# Patient Record
Sex: Female | Born: 1955 | Race: Black or African American | Hispanic: No | Smoking: Former smoker
Health system: Southern US, Community
[De-identification: ages and names within clinical notes are randomized; demographics above are authoritative.]

## PROBLEM LIST (undated history)

## (undated) DIAGNOSIS — R920 Mammographic microcalcification found on diagnostic imaging of breast: Secondary | ICD-10-CM

## (undated) DIAGNOSIS — K635 Polyp of colon: Secondary | ICD-10-CM

## (undated) DIAGNOSIS — D0590 Unspecified type of carcinoma in situ of unspecified breast: Secondary | ICD-10-CM

## (undated) DIAGNOSIS — E119 Type 2 diabetes mellitus without complications: Secondary | ICD-10-CM

## (undated) DIAGNOSIS — C50919 Malignant neoplasm of unspecified site of unspecified female breast: Secondary | ICD-10-CM

## (undated) DIAGNOSIS — N6459 Other signs and symptoms in breast: Secondary | ICD-10-CM

## (undated) DIAGNOSIS — I10 Essential (primary) hypertension: Secondary | ICD-10-CM

## (undated) HISTORY — DX: Mammographic microcalcification found on diagnostic imaging of breast: R92.0

## (undated) HISTORY — DX: Essential (primary) hypertension: I10

## (undated) HISTORY — DX: Other signs and symptoms in breast: N64.59

## (undated) HISTORY — DX: Polyp of colon: K63.5

## (undated) HISTORY — DX: Type 2 diabetes mellitus without complications: E11.9

## (undated) HISTORY — DX: Unspecified type of carcinoma in situ of unspecified breast: D05.90

---

## 1996-02-10 HISTORY — PX: TONSILLECTOMY: SUR1361

## 2004-09-28 ENCOUNTER — Emergency Department: Payer: Self-pay | Admitting: Emergency Medicine

## 2005-01-30 ENCOUNTER — Emergency Department: Payer: Self-pay | Admitting: Emergency Medicine

## 2005-07-15 ENCOUNTER — Other Ambulatory Visit: Payer: Self-pay

## 2005-07-15 ENCOUNTER — Emergency Department: Payer: Self-pay | Admitting: Emergency Medicine

## 2008-02-10 DIAGNOSIS — E119 Type 2 diabetes mellitus without complications: Secondary | ICD-10-CM

## 2008-02-10 HISTORY — DX: Type 2 diabetes mellitus without complications: E11.9

## 2008-02-10 HISTORY — PX: GASTRIC BYPASS: SHX52

## 2009-01-13 ENCOUNTER — Emergency Department: Payer: Self-pay | Admitting: Unknown Physician Specialty

## 2009-02-26 ENCOUNTER — Emergency Department: Payer: Self-pay | Admitting: Internal Medicine

## 2010-09-01 ENCOUNTER — Ambulatory Visit: Payer: Self-pay | Admitting: Family Medicine

## 2011-02-10 DIAGNOSIS — N6459 Other signs and symptoms in breast: Secondary | ICD-10-CM

## 2011-02-10 HISTORY — DX: Other signs and symptoms in breast: N64.59

## 2012-01-04 ENCOUNTER — Ambulatory Visit: Payer: Self-pay | Admitting: Family Medicine

## 2012-01-27 ENCOUNTER — Ambulatory Visit: Payer: Self-pay | Admitting: Family Medicine

## 2012-01-29 ENCOUNTER — Ambulatory Visit: Payer: Self-pay | Admitting: Family Medicine

## 2012-02-01 ENCOUNTER — Emergency Department: Payer: Self-pay | Admitting: Emergency Medicine

## 2012-02-10 DIAGNOSIS — D0590 Unspecified type of carcinoma in situ of unspecified breast: Secondary | ICD-10-CM

## 2012-02-10 DIAGNOSIS — R920 Mammographic microcalcification found on diagnostic imaging of breast: Secondary | ICD-10-CM

## 2012-02-10 HISTORY — PX: BREAST MAMMOSITE: SHX5264

## 2012-02-10 HISTORY — PX: BREAST BIOPSY: SHX20

## 2012-02-10 HISTORY — DX: Mammographic microcalcification found on diagnostic imaging of breast: R92.0

## 2012-02-10 HISTORY — DX: Unspecified type of carcinoma in situ of unspecified breast: D05.90

## 2012-02-29 ENCOUNTER — Ambulatory Visit: Payer: Self-pay | Admitting: General Surgery

## 2012-03-07 ENCOUNTER — Ambulatory Visit: Payer: Self-pay | Admitting: General Surgery

## 2012-03-07 LAB — POTASSIUM: Potassium: 4 mmol/L (ref 3.5–5.1)

## 2012-03-10 LAB — PATHOLOGY REPORT

## 2012-03-12 DIAGNOSIS — C50919 Malignant neoplasm of unspecified site of unspecified female breast: Secondary | ICD-10-CM

## 2012-03-12 HISTORY — PX: BREAST LUMPECTOMY: SHX2

## 2012-03-12 HISTORY — DX: Malignant neoplasm of unspecified site of unspecified female breast: C50.919

## 2012-03-15 ENCOUNTER — Ambulatory Visit: Payer: Self-pay | Admitting: General Surgery

## 2012-03-31 ENCOUNTER — Ambulatory Visit: Payer: Self-pay | Admitting: Radiation Oncology

## 2012-04-09 ENCOUNTER — Ambulatory Visit: Payer: Self-pay | Admitting: Radiation Oncology

## 2012-05-10 ENCOUNTER — Ambulatory Visit: Payer: Self-pay | Admitting: Radiation Oncology

## 2012-05-13 ENCOUNTER — Encounter: Payer: Self-pay | Admitting: *Deleted

## 2012-05-13 DIAGNOSIS — D0590 Unspecified type of carcinoma in situ of unspecified breast: Secondary | ICD-10-CM | POA: Insufficient documentation

## 2012-05-23 ENCOUNTER — Encounter: Payer: Self-pay | Admitting: General Surgery

## 2012-05-23 ENCOUNTER — Ambulatory Visit (INDEPENDENT_AMBULATORY_CARE_PROVIDER_SITE_OTHER): Payer: BC Managed Care – PPO | Admitting: General Surgery

## 2012-05-23 VITALS — BP 130/74 | HR 74 | Resp 14 | Ht 68.0 in | Wt 213.0 lb

## 2012-05-23 DIAGNOSIS — D0590 Unspecified type of carcinoma in situ of unspecified breast: Secondary | ICD-10-CM

## 2012-05-23 DIAGNOSIS — D0592 Unspecified type of carcinoma in situ of left breast: Secondary | ICD-10-CM

## 2012-05-23 NOTE — Patient Instructions (Addendum)
Patient to return in August 2014 after unilateral left breast diagnostic mammogram.

## 2012-05-23 NOTE — Progress Notes (Signed)
Patient ID: Martha Waller, female   DOB: 1955-08-20, 57 y.o.   MRN: [id number]  Chief Complaint  Patient presents with  . Follow-up    breast cancer    HPI [name] is a 57 y.o. 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 Patient had a left breast lumpectomy in 2/14  Followed by Mammosite radiation.Patient states no new breast problems.Patient is still taking her tamoxifen. HPI  Past Medical History  Diagnosis Date  . 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 Hypertension   . Mammographic microcalcification 2014  . 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 Breast complaint 2013    Past Surgical History  Procedure Laterality Date  . Breast mammosite Left 2014  . Breast mammosite Left 2014    removal  . Colonoscopy w/ polypectomy  2011    2 small benign polyps removed, done by [facility]  . Breast lumpectomy Left February 2014  . Tonsillectomy  1998  . Gastric bypass  2010    Family History  Problem Relation Age of Onset  . Cancer Maternal Grandmother 60    throat cancer    Social History History  Substance Use Topics  . Smoking status: Former Smoker -- 0.50 packs/day for 20 years    Types: Cigarettes  . Smokeless tobacco: Never Used  . Alcohol Use: No    No Known Allergies  Current Outpatient Prescriptions  Medication Sig Dispense Refill  . Calcium Carbonate-Vitamin D (CALCIUM 600 + D PO) Take by mouth.      . Furosemide (LASIX PO) Take by mouth.      . hydrochlorothiazide (HYDRODIURIL) 50 MG tablet Take 50 mg by mouth daily.      Marland Kitchen levothyroxine (SYNTHROID, LEVOTHROID) 175 MCG tablet Take 175 mcg by mouth daily before breakfast.      . lisinopril (PRINIVIL,ZESTRIL) 10 MG tablet Take 10 mg by mouth daily.      . Multiple Vitamin (MULTIVITAMIN) tablet Take 1 tablet by mouth daily.      . tamoxifen (NOLVADEX) 20 MG tablet Take 20 mg by mouth daily.       No current  facility-administered medications for this visit.    Review of Systems Review of Systems  Constitutional: Negative.   Respiratory: Negative.   Cardiovascular: Negative.     Blood pressure 130/74, pulse 74, resp. rate 14, height 5\' 8"  (1.727 m), weight 213 lb (96.616 kg).  Physical Exam Physical Exam  Constitutional: She appears well-developed and well-nourished.  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 Doing well     Plan    Continue periodic monitoring.        SANKAR,SEEPLAPUTHUR G 05/23/2012, 7:54 PM

## 2012-06-17 ENCOUNTER — Ambulatory Visit: Payer: Self-pay | Admitting: Radiation Oncology

## 2012-09-15 ENCOUNTER — Ambulatory Visit: Payer: Self-pay | Admitting: General Surgery

## 2012-09-16 ENCOUNTER — Encounter: Payer: Self-pay | Admitting: General Surgery

## 2012-09-22 ENCOUNTER — Encounter: Payer: Self-pay | Admitting: General Surgery

## 2012-09-22 ENCOUNTER — Ambulatory Visit (INDEPENDENT_AMBULATORY_CARE_PROVIDER_SITE_OTHER): Payer: BC Managed Care – PPO | Admitting: General Surgery

## 2012-09-22 VITALS — BP 120/80 | HR 82 | Resp 16 | Ht 68.0 in | Wt 265.0 lb

## 2012-09-22 DIAGNOSIS — Z853 Personal history of malignant neoplasm of breast: Secondary | ICD-10-CM

## 2012-09-22 NOTE — Progress Notes (Signed)
Patient ID: Martha Waller, female   DOB: 10-29-1955, 57 y.o.   MRN: [id number]  Chief Complaint  Patient presents with  . Breast Cancer Long Term Follow Up    follow up mammogram     HPI Martha Waller is a 57 y.o. female who presents for a follow up breast evaluation. The most recent mammogram was done on 09/15/12 with a birad category 1. The patient denies any new problems with her breasts at this time. She is 6 months post left lumpectomy and radiation for DCIS.    HPI  Past Medical History  Diagnosis Date  . 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 Hypertension   . Mammographic microcalcification 2014  . 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 Breast complaint 2013    Past Surgical History  Procedure Laterality Date  . Breast mammosite Left 2014  . Breast mammosite Left 2014    removal  . Colonoscopy w/ polypectomy  2011    2 small benign polyps removed, done by [facility]  . Breast lumpectomy Left February 2014  . Tonsillectomy  1998  . Gastric bypass  2010    Family History  Problem Relation Age of Onset  . Cancer Maternal Grandmother 60    throat cancer    Social History History  Substance Use Topics  . Smoking status: Former Smoker -- 0.50 packs/day for 20 years    Types: Cigarettes  . Smokeless tobacco: Never Used  . Alcohol Use: No    No Known Allergies  Current Outpatient Prescriptions  Medication Sig Dispense Refill  . Calcium Carbonate-Vitamin D (CALCIUM 600 + D PO) Take by mouth.      . Furosemide (LASIX PO) Take by mouth.      . hydrochlorothiazide (HYDRODIURIL) 50 MG tablet Take 50 mg by mouth daily.      Marland Kitchen levothyroxine (SYNTHROID, LEVOTHROID) 175 MCG tablet Take 175 mcg by mouth daily before breakfast.      . lisinopril (PRINIVIL,ZESTRIL) 10 MG tablet Take 10 mg by mouth daily.      . Multiple Vitamin (MULTIVITAMIN) tablet Take 1 tablet by mouth daily.      . tamoxifen  (NOLVADEX) 20 MG tablet Take 20 mg by mouth daily.       No current facility-administered medications for this visit.    Review of Systems Review of Systems  Constitutional: Negative.   Respiratory: Negative.   Cardiovascular: Negative.     Blood pressure 120/80, pulse 82, resp. rate 16, height 5\' 8"  (1.727 m), weight 265 lb (120.203 kg).  Physical Exam Physical Exam  Constitutional: She is oriented to person, place, and time. She appears well-developed and well-nourished.  Eyes: Conjunctivae are normal. No scleral icterus.  Neck: No tracheal deviation present. No mass and no thyromegaly present.  Cardiovascular: Normal rate, regular rhythm, normal heart sounds and normal pulses.   No murmur heard. Pulses:      Dorsalis pedis pulses are 2+ on the right side, and 2+ on the left side.       Posterior tibial pulses are 2+ on the right side.  No edema. No calf or thigh tenderness.  Pulmonary/Chest: Effort normal and breath sounds normal. Right breast exhibits no inverted nipple, no mass, no nipple discharge, no skin change and no tenderness. Left breast exhibits no inverted nipple, no mass, no nipple discharge, no skin change and no tenderness.  Well healed incision site  of left breast.   Abdominal: Soft. Normal appearance and bowel sounds are normal. There is no hepatomegaly. There is no tenderness. No hernia.  Lymphadenopathy:    She has no cervical adenopathy.    She has no axillary adenopathy.  Neurological: She is alert and oriented to person, place, and time.  Skin: Skin is warm and dry.    Data Reviewed Mammogram reviewed.   Assessment    Stable exam.      Plan    Follow up in 6 months with bilateral diagnostic mammogram.  Continue with Tamoxifen       Darthy Manganelli G 09/22/2012, 6:52 PM

## 2012-09-22 NOTE — Patient Instructions (Addendum)
Patient advised to continue self exams. Follow up in 6 months with bilateral diagnostic mammogram.

## 2012-12-07 ENCOUNTER — Emergency Department: Payer: Self-pay | Admitting: Internal Medicine

## 2013-03-21 ENCOUNTER — Ambulatory Visit: Payer: Self-pay | Admitting: General Surgery

## 2013-03-21 ENCOUNTER — Encounter: Payer: Self-pay | Admitting: General Surgery

## 2013-03-29 ENCOUNTER — Ambulatory Visit (INDEPENDENT_AMBULATORY_CARE_PROVIDER_SITE_OTHER): Payer: BC Managed Care – PPO | Admitting: General Surgery

## 2013-03-29 ENCOUNTER — Encounter: Payer: Self-pay | Admitting: General Surgery

## 2013-03-29 VITALS — BP 140/80 | HR 72 | Resp 14 | Ht 68.0 in | Wt 263.0 lb

## 2013-03-29 DIAGNOSIS — D0510 Intraductal carcinoma in situ of unspecified breast: Secondary | ICD-10-CM

## 2013-03-29 DIAGNOSIS — D0590 Unspecified type of carcinoma in situ of unspecified breast: Secondary | ICD-10-CM

## 2013-03-29 NOTE — Patient Instructions (Signed)
Continue self breast exams. Call office for any new breast issues or concerns. 

## 2013-03-29 NOTE — Progress Notes (Signed)
Patient ID: Martha Waller, female   DOB: 1955/10/10, 58 y.o.   MRN: [id number]  Chief Complaint  Patient presents with  . Follow-up    mammogram follow up and history of breast cancer    HPI Martha Waller is a 58 y.o. female.  who presents for her follow up mammogram and breast evaluation. The most recent mammogram was done on 03-22-13. She has known history of DCIS left breast cancer. Patient does perform regular self breast checks and gets regular mammograms done.  No new breast issues. She is on Tamoxifen and states she is having muscle/joint aches, night sweats and weight gain. Symptoms are mild and tolerable.  HPI  Past Medical History  Diagnosis Date  . Diabetes mellitus without complication 3536    type 2, resolved since gastric bypass surgery  . Hypertension   . Mammographic microcalcification 2014  . 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 Breast complaint 2013  . Cancer Feb 2014    DCIS, breast    Past Surgical History  Procedure Laterality Date  . Breast mammosite Left 2014  . Breast mammosite Left 2014    removal  . Colonoscopy w/ polypectomy  2011    2 small benign polyps removed, done by [facility]  . Breast lumpectomy Left February 2014  . Tonsillectomy  1998  . Gastric bypass  2010    Family History  Problem Relation Age of Onset  . Cancer Maternal Grandmother 60    throat cancer    Social History History  Substance Use Topics  . Smoking status: Former Smoker -- 0.50 packs/day for 20 years    Types: Cigarettes  . Smokeless tobacco: Never Used  . Alcohol Use: No    No Known Allergies  Current Outpatient Prescriptions  Medication Sig Dispense Refill  . Calcium Carbonate-Vitamin D (CALCIUM 600 + D PO) Take by mouth.      . Furosemide (LASIX PO) Take by mouth.      . hydrochlorothiazide (HYDRODIURIL) 50 MG tablet Take 50 mg by mouth daily.      Marland Kitchen levothyroxine (SYNTHROID, LEVOTHROID) 175 MCG tablet Take 175 mcg by mouth daily before  breakfast.      . lisinopril (PRINIVIL,ZESTRIL) 10 MG tablet Take 10 mg by mouth daily.      . Multiple Vitamin (MULTIVITAMIN) tablet Take 1 tablet by mouth daily.      . tamoxifen (NOLVADEX) 20 MG tablet Take 20 mg by mouth daily.       No current facility-administered medications for this visit.    Review of Systems Review of Systems  Constitutional: Negative.   Respiratory: Negative.   Cardiovascular: Negative.     Blood pressure 140/80, pulse 72, resp. rate 14, height 5\' 8"  (1.727 m), weight 263 lb (119.296 kg).  Physical Exam Physical Exam  Constitutional: She is oriented to person, place, and time. She appears well-developed and well-nourished.  Eyes: No scleral icterus.  Neck: Neck supple.  Cardiovascular: Normal rate, regular rhythm and normal heart sounds.   Pulses:      Dorsalis pedis pulses are 2+ on the right side, and 2+ on the left side.       Posterior tibial pulses are 2+ on the right side, and 2+ on the left side.  No lower leg edema.  Pulmonary/Chest: Effort normal and breath sounds normal. Right breast exhibits no inverted nipple, no mass, no nipple discharge, no skin change and no tenderness. Left breast exhibits no inverted nipple,  no mass, no nipple discharge, no skin change and no tenderness.  Left breast incision well healed, upper inner quadrant.  Abdominal: Soft. Normal appearance. There is no hepatosplenomegaly. There is no tenderness. No hernia.  Lymphadenopathy:    She has no cervical adenopathy.    She has no axillary adenopathy.  Neurological: She is alert and oriented to person, place, and time. She has normal strength. No sensory deficit.  Skin: Skin is warm and dry.    Data Reviewed Mammogram reviewed and stable.  Assessment    Stable physical exam.    Plan    Return in one year bilateral diagnostic mammogram and office visit.       Kaiven Vester G 03/29/2013, 12:45 PM

## 2013-04-03 ENCOUNTER — Telehealth: Payer: Self-pay | Admitting: *Deleted

## 2013-04-03 NOTE — Telephone Encounter (Signed)
Patient notified that Dr. Jamal Collin has reviewed her most recent colonoscopy and pathology report that was done by Dr. Phylis Bougie on 09-03-10. This patient is aware that she will be due for her colonoscopy either later this year or she can push off till beginning of next year. She states that she would rather do the later. Patient is already in recalls for 03-2014 and she will discuss colonoscopy with Dr. Jamal Collin at that time.

## 2013-06-13 ENCOUNTER — Telehealth: Payer: Self-pay | Admitting: *Deleted

## 2013-06-13 NOTE — Telephone Encounter (Signed)
Pt called and said that Tamoxifen that you prescribed her is making her gain a lot of weight, she went to her primary doctor and they gave her a weight loss pill Belviq, she was just wondering is this okay to take with Tamoxifen and will it keep her from increasing in weight.

## 2013-06-13 NOTE — Telephone Encounter (Signed)
Ok to take the medication per Dr. Jamal Collin, she states she needs it in writing so the MD will prescribe it for her.

## 2013-12-11 ENCOUNTER — Encounter: Payer: Self-pay | Admitting: General Surgery

## 2014-03-05 ENCOUNTER — Encounter: Payer: Self-pay | Admitting: General Surgery

## 2014-03-28 ENCOUNTER — Encounter: Payer: Self-pay | Admitting: General Surgery

## 2014-04-10 ENCOUNTER — Ambulatory Visit (INDEPENDENT_AMBULATORY_CARE_PROVIDER_SITE_OTHER): Payer: BC Managed Care – PPO | Admitting: General Surgery

## 2014-04-10 ENCOUNTER — Encounter: Payer: Self-pay | Admitting: General Surgery

## 2014-04-10 VITALS — BP 132/78 | HR 74 | Resp 12 | Ht 68.0 in | Wt 268.0 lb

## 2014-04-10 DIAGNOSIS — D0512 Intraductal carcinoma in situ of left breast: Secondary | ICD-10-CM

## 2014-04-10 DIAGNOSIS — Z1211 Encounter for screening for malignant neoplasm of colon: Secondary | ICD-10-CM

## 2014-04-10 MED ORDER — POLYETHYLENE GLYCOL 3350 17 GM/SCOOP PO POWD: 1.0000 | Freq: Once | ORAL | Status: DC

## 2014-04-10 MED ORDER — TAMOXIFEN CITRATE 20 MG PO TABS: 20.0000 mg | ORAL_TABLET | Freq: Every day | ORAL | Status: DC

## 2014-04-10 NOTE — Patient Instructions (Addendum)
Colonoscopy A colonoscopy is an exam to look at the entire large intestine (colon). This exam can help find problems such as tumors, polyps, inflammation, and areas of bleeding. The exam takes about 1 hour.  LET [facility] CARE PROVIDER KNOW ABOUT:   Any allergies you have.  All medicines you are taking, including vitamins, herbs, eye drops, creams, and over-the-counter medicines.  Previous problems you or members of your family have had with the use of anesthetics.  Any blood disorders you have.  Previous surgeries you have had.  Medical conditions you have. RISKS AND COMPLICATIONS  Generally, this is a safe procedure. However, as with any procedure, complications can occur. Possible complications include:  Bleeding.  Tearing or rupture of the colon wall.  Reaction to medicines given during the exam.  Infection (rare). BEFORE THE PROCEDURE   Ask your health care provider about changing or stopping your regular medicines.  You may be prescribed an oral bowel prep. This involves drinking a large amount of medicated liquid, starting the day before your procedure. The liquid will cause you to have multiple loose stools until your stool is almost clear or light green. This cleans out your colon in preparation for the procedure.  Do not eat or drink anything else once you have started the bowel prep, unless your health care provider tells you it is safe to do so.  Arrange for someone to drive you home after the procedure. PROCEDURE   You will be given medicine to help you relax (sedative).  You will lie on your side with your knees bent.  A long, flexible tube with a light and camera on the end (colonoscope) will be inserted through the rectum and into the colon. The camera sends video back to a computer screen as it moves through the colon. The colonoscope also releases carbon dioxide gas to inflate the colon. This helps your health care provider see the area better.  During  the exam, your health care provider may take a small tissue sample (biopsy) to be examined under a microscope if any abnormalities are found.  The exam is finished when the entire colon has been viewed. AFTER THE PROCEDURE   Do not drive for 24 hours after the exam.  You may have a small amount of blood in your stool.  You may pass moderate amounts of gas and have mild abdominal cramping or bloating. This is caused by the gas used to inflate your colon during the exam.  Ask when your test results will be ready and how you will get your results. Make sure you get your test results. Document Released: 01/24/2000 Document Revised: 11/16/2012 Document Reviewed: 10/03/2012 [employer] Patient Information 2015 [place], [place]. This information is not intended to replace advice given to you by your health care provider. Make sure you discuss any questions you have with your health care provider.  Patient is scheduled for a colonoscopy at [facility] on 04/25/14. She is aware to pre register with the hospital at least 2 days prior. She will only take her Lisinopril and Hydrochlorothiazide at 6 am with a small sip of water. Miralax prescription has been sent into her pharmacy. Patient is aware of date and instructions.

## 2014-04-10 NOTE — Progress Notes (Signed)
Patient ID: Martha Waller, female   DOB: [birth year], 59 y.o.   MRN: [id number]  Chief Complaint  Patient presents with  . Follow-up    mammogram    HPI Martha Waller is a 59 y.o. female.  who presents for a breast evaluation. The most recent mammogram was done on 04-02-14.  Patient does perform regular self breast checks and gets regular mammograms done. No new breast issues. She is tolerating the tamoxifen for DCIS left breast.  Discuss colonoscopy as well.  HPI  Past Medical History  Diagnosis Date  . Diabetes mellitus without complication 8676    type 2, resolved since gastric bypass surgery  . Hypertension   . Mammographic microcalcification 2014  . 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 Breast complaint 2013  . Cancer Feb 2014    DCIS, breast  . Colon polyp     Past Surgical History  Procedure Laterality Date  . Breast mammosite Left 2014  . Breast mammosite Left 2014    removal  . Colonoscopy w/ polypectomy  2012    2 small benign polyps removed, done by [facility]  . Breast lumpectomy Left February 2014  . Tonsillectomy  1998  . Gastric bypass  2010    Family History  Problem Relation Age of Onset  . Cancer Maternal Grandmother 60    throat cancer    Social History History  Substance Use Topics  . Smoking status: Former Smoker -- 0.50 packs/day for 20 years    Types: Cigarettes  . Smokeless tobacco: Never Used  . Alcohol Use: No    No Known Allergies  Current Outpatient Prescriptions  Medication Sig Dispense Refill  . Calcium Carbonate-Vitamin D (CALCIUM 600 + D PO) Take by mouth.    . hydrochlorothiazide (HYDRODIURIL) 50 MG tablet Take 50 mg by mouth daily.    Marland Kitchen levothyroxine (SYNTHROID, LEVOTHROID) 175 MCG tablet Take 175 mcg by mouth daily before breakfast.    . lisinopril (PRINIVIL,ZESTRIL) 10 MG tablet Take 10 mg by mouth daily.    . Multiple Vitamin (MULTIVITAMIN) tablet Take 1 tablet by mouth daily.    . tamoxifen  (NOLVADEX) 20 MG tablet Take 20 mg by mouth daily.    . polyethylene glycol powder (GLYCOLAX/MIRALAX) powder Take 255 g by mouth once. 255 g 0  . tamoxifen (NOLVADEX) 20 MG tablet Take 1 tablet (20 mg total) by mouth daily. 90 tablet 3   No current facility-administered medications for this visit.    Review of Systems Review of Systems  Constitutional: Negative.   Respiratory: Negative.   Cardiovascular: Negative.     Blood pressure 132/78, pulse 74, resp. rate 12, height 5\' 8"  (1.727 m), weight 268 lb (121.564 kg).  Physical Exam Physical Exam  Constitutional: She is oriented to person, place, and time. She appears well-developed and well-nourished.  Eyes: Conjunctivae are normal. No scleral icterus.  Neck: Neck supple.  Cardiovascular: Normal rate and regular rhythm.   Pulmonary/Chest: Effort normal and breath sounds normal. Right breast exhibits no inverted nipple, no mass, no nipple discharge, no skin change and no tenderness. Left breast exhibits no inverted nipple, no mass, no nipple discharge, no skin change and no tenderness.  Left breast lumpectomy site with small area of thickening, unchanged from before.  Abdominal: Soft. There is no tenderness.  Lymphadenopathy:    She has no cervical adenopathy.    She has no axillary adenopathy.  Neurological: She is alert and oriented to  person, place, and time.  Skin: Skin is warm and dry.    Data Reviewed Mammogram report reviewed and stable.  Assessment    Stable physical exam. DCIS left breast    Plan    The patient has been asked to return to the office in one year with a bilateral diagnostic mammogram. Continue self breast exams. Call office for any new breast issues or concerns. Pt in need of screening colonoscopy      Colonoscopy with possible biopsy/polypectomy prn: Information regarding the procedure, including its potential risks and complications (including but not limited to perforation of the bowel, which  may require emergency surgery to repair, and bleeding) was verbally given to the patient. Educational information regarding lower instestinal endoscopy was given to the patient. Written instructions for how to complete the bowel prep using Miralax were provided. The importance of drinking ample fluids to avoid dehydration as a result of the prep emphasized.  Patient is scheduled for a colonoscopy at [facility] on 04/25/14. She is aware to pre register with the hospital at least 2 days prior. She will only take her Lisinopril and Hydrochlorothiazide at 6 am with a small sip of water. Miralax prescription has been sent into her pharmacy. Patient is aware of date and instructions.   SANKAR,SEEPLAPUTHUR G 04/11/2014, 1:13 PM

## 2014-04-18 ENCOUNTER — Other Ambulatory Visit: Payer: Self-pay | Admitting: General Surgery

## 2014-04-18 DIAGNOSIS — Z86010 Personal history of colonic polyps: Secondary | ICD-10-CM

## 2014-04-25 ENCOUNTER — Telehealth: Payer: Self-pay | Admitting: *Deleted

## 2014-04-25 NOTE — Telephone Encounter (Signed)
Patient was a no show for colonoscopy that was scheduled today at [facility] per Endoscopy.  This patient reports she got date confused. She has been rescheduled to next Tuesday, 05-01-14. Prep instructions reviewed again with the patient by phone today.  Trish in [facility] Endoscopy aware of date change. Per Wannetta Sender, patient will not need to pre-register again.   This patient was instructed to call the office should she have further questions.

## 2014-05-01 ENCOUNTER — Ambulatory Visit: Payer: Self-pay | Admitting: General Surgery

## 2014-05-01 DIAGNOSIS — K5730 Diverticulosis of large intestine without perforation or abscess without bleeding: Secondary | ICD-10-CM

## 2014-05-01 DIAGNOSIS — Z86010 Personal history of colonic polyps: Secondary | ICD-10-CM

## 2014-05-01 DIAGNOSIS — K621 Rectal polyp: Secondary | ICD-10-CM

## 2014-05-01 HISTORY — PX: COLONOSCOPY W/ POLYPECTOMY: SHX1380

## 2014-05-02 ENCOUNTER — Encounter: Payer: Self-pay | Admitting: General Surgery

## 2014-05-07 ENCOUNTER — Encounter: Payer: Self-pay | Admitting: General Surgery

## 2014-05-08 ENCOUNTER — Telehealth: Payer: Self-pay | Admitting: *Deleted

## 2014-05-08 NOTE — Telephone Encounter (Signed)
-----   Message from Christene Lye, MD sent at 05/07/2014  3:36 PM EDT ----- Please let pt pt know the pathology was normal.

## 2014-05-08 NOTE — Telephone Encounter (Signed)
5 yr colonoscopy recall

## 2014-05-08 NOTE — Telephone Encounter (Signed)
Notified patient as instructed, patient pleased. When will she need follow-up appointments?

## 2014-06-01 NOTE — Op Note (Signed)
PATIENT NAME:  Martha Waller, STETLER MR#:  [id number] DATE OF BIRTH:  Mar 15, 1955  DATE OF PROCEDURE:  03/15/2012  PREOPERATIVE DIAGNOSIS: Ductal carcinoma in situ, left breast.  POSTOPERATIVE DIAGNOSIS:  Ductal carcinoma in situ, left breast.   OPERATION: Left breast lumpectomy.   SURGEON: S.G. Jamal Collin, M.D.   ANESTHESIA: General.   COMPLICATIONS: None.   ESTIMATED BLOOD LOSS: Less than 10 mL.   DRAINS: None.   DESCRIPTION OF PROCEDURE: The patient underwent wire localization of the biopsy site in the upper inner quadrant of the left breast approximately at the 11 o'clock location. The patient was brought to the operating room and placed in the supine position on the operating table and put to sleep with an LMA. The left breast was prepped and draped on a sterile field. Time out was performed, and the skin incision was marked in the upper outer quadrant, just below the wire entrance site. Skin incision was then made and carefully deepened through the subcutaneous tissue down to the glandular element. The skin and subcutaneous tissue was then elevated on both sides for approximately 2 cm. The wire entrance site was then identified, and the wire was freed from the skin and subcutaneous tissue.   Following this, based on the wire localization mammogram, a complete excision of the previous biopsy site was performed with cautery for control of bleeding. The excised tissue was then tagged for margins. The specimen mammogram was obtained showing that the clip was within this specimen, and the tissue sent subsequently to pathology for margin inking.   The assessment grossly suggested that the closest margin being on the medial aspect approximately 0.2 cm from the biopsy cavity. The surrounding tissue was inspected and showed no gross abnormalities.   After ensuring hemostasis, the wound was irrigated and closed. The glandular tissue was approximated with 2-0 Vicryl. The subcutaneous tissue was also  closed with 2-0 Vicryl. The skin was closed with subcuticular 4-0 Vicryl and covered with Dermabond. The procedure was well tolerated. The patient subsequently was returned to the recovery room in stable condition.    ____________________________ S.Robinette Haines, MD sgs:cc D: 03/15/2012 15:16:58 ET T: 03/15/2012 16:44:47 ET JOB#: [id number]  cc: S.G. Jamal Collin, MD, <Dictator> [facility] Robinette Haines MD ELECTRONICALLY SIGNED 03/16/2012 17:12

## 2014-06-01 NOTE — Consult Note (Signed)
Reason for Visit: This 59 year old Female patient presents to the clinic for initial evaluation of  breast cancer .   Referred by Dr. Jamal Collin.  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  Pathology Report pathology report reviewed   Imaging Report mammograms and ultrasound reviewed   Referral Report clinical notes reviewed   Planned Treatment Regimen accelerated partial breast irradiation   HPI   patient is a 59 year old female who presented with abnormal mammogramshowing a pleomorphic cluster of calcifications in the superior medial aspect of the left breast.she went on to have a needle localization which was positive for ductal carcinoma in situ. Patient on to have a wide local excision showing a 2 cm mass of ductal carcinoma in situ overall nuclear grade 2. Margins were negative. Tumor was strongly ER/PR positive. Margins were close for DCIS at 1 mm from the medial margin. She has tolerated her surgery well and is seen today for consideration of treatment. She specifically denies breast tenderness cough or bone pain. She otherwise is in excellent clinical condition.  Past Hx:    Breast Cancer:    HTN:    ectopic pregnanc:   Past, Family and Social History:  Past Medical History positive   Cardiovascular hypertension   Past Surgical History history of ectopic pregnancy   Family History noncontributory   Social History noncontributory   Additional Past Medical and Surgical History patient seen by herself.   Allergies:   No Known Allergies:   Home Meds:  Home Medications: Medication Instructions Status  hydrochlorothiazide 25 mg oral tablet 1 tab(s) orally once a day in am Active  levothyroxine 175 mcg (0.175 mg) oral tablet 1 tab(s) orally once a day in am Active  linisopril 10mg  1 tab(s) orally once a day (in the morning) Active  furosemide 40 mg oral  tablet 0.5 tab(s) orally once a day in am Active  atenolol 50 mg oral tablet 1 tab(s) orally once a day in am Active  cyclobenzaprine 10 mg oral tablet 1 tab(s) orally once a day (at bedtime), As Needed Active  gabapentin 300mg  1 tab(s) orally once a day, As Needed- for Pain  Active  calcium 1200mg  2 tab(s) orally once a day in am Active  one a day vitamin 2 tab(s) orally once a day in am Active  tramadol 50mg  tab(s) orally every 6 hours, As Needed Active   Review of Systems:  General negative   Performance Status (ECOG) 0   Skin negative   Breast see HPI   Ophthalmologic negative   ENMT negative   Respiratory and Thorax negative   Cardiovascular negative   Gastrointestinal negative   Genitourinary negative   Musculoskeletal negative   Neurological negative   Psychiatric negative   Hematology/Lymphatics negative   Endocrine negative   Allergic/Immunologic negative   Review of Systems   according to nurses notes Patient denies any weight loss, fatigue, weakness, fever, chills or night sweats. Patient denies any loss of vision, blurred vision. Patient denies any ringing  of the ears or hearing loss. No irregular heartbeat. Patient denies heart murmur or history of fainting. Patient denies any chest pain or pain radiating to her upper extremities. Patient denies any shortness of breath, difficulty breathing at night, cough or hemoptysis. Patient denies any swelling in the lower legs. Patient denies any nausea vomiting, vomiting of blood, or coffee ground material in the vomitus.  Patient denies any stomach pain. Patient states has had normal bowel movements no significant constipation or diarrhea. Patient denies any dysuria, hematuria or significant nocturia. Patient denies any problems walking, swelling in the joints or loss of balance. Patient denies any skin changes, loss of hair or loss of weight. Patient denies any excessive worrying or anxiety or significant depression.  Patient denies any problems with insomnia. Patient denies excessive thirst, polyuria, polydipsia. Patient denies any swollen glands, patient denies easy bruising or easy bleeding. Patient denies any recent infections, allergies or URI. Patient "s visual fields have not changed significantly in recent time.  Nursing Notes:  Nursing Vital Signs and Chemo Nursing Nursing Notes: *CC Vital Signs Flowsheet:   20-Feb-14 08:20  Pulse Pulse 81  Respirations Respirations 20  SBP SBP 118  DBP DBP 74  Pain Scale (0-10)  2  Current Weight (kg) (kg) 117  Height (cm) centimeters 169.6  BSA (m2) 2.2   Physical Exam:  General/Skin/HEENT:  General normal   Skin normal   Eyes normal   ENMT normal   Head and Neck normal   Additional PE well-developed obese female in NAD. Lungs are clear to A&P cardiac examination shows regular rate and rhythm. Abdomen is benign with no organomegaly or masses noted. She status post wide local excision of left breast. Incision is well-healed. No dominant mass or nodularity is noted in either breast into position examined. No axillary or supraclavicular adenopathy is appreciated.   Breasts/Resp/CV/GI/GU:  Respiratory and Thorax normal   Cardiovascular normal   Gastrointestinal normal   Genitourinary normal   MS/Neuro/Psych/Lymph:  Musculoskeletal normal   Neurological normal   Lymphatics normal   Other Results:  Radiology Results: LabUnknown:    18-Dec-13 14:27, Screening Digital Mammogram  PACS Image     20-Dec-13 09:13, Digital Additional Views Lt Breast [facility])  PACS Image   [facility]:  Digital Additional Views Lt Breast (SCR)   REASON FOR EXAM:    av lt microcals  COMMENTS:       PROCEDURE: MAM - MAM DGTL ADD VW LT  SCR  - Jan 29 2012  9:13AM     RESULT: TECHNIQUE: Digital diagnostic left mammograms were obtained. FDA   approved computer-aided detection (CAD) for mammography was utilized for   this study.    FINDING:      True  lateral view and spot magnification views of the left breast were   performed. There is a pleomorphic cluster of microcalcifications in the a   superior medial left breast with a linear branching pattern. There is no   associated soft tissue mass or architectural distortion.  IMPRESSION:     1.    Pleomorphic cluster of microcalcifications in the superior medial   left breast. Recommend stereotactic guided biopsy for tissue diagnosis.    BI-RADS:  Category 4 - Suspicious Abnormality    A negative mammogram report does not preclude biopsy or other evaluation   of a clinically palpable or otherwise suspicious mass or lesion. Breast   cancer may not be detected by mammography in up to 10% of cases.    Thank you for the opportunity to contribute to the care of your patient.    Dictation site:  1    Verified By: Jennette Banker, M.D., MD   Relevent Results:   Relevant Scans and Labs mammograms reviewed   Assessment and Plan: Impression:   ductal carcinoma in situ ER/PR positive status post wide local excision in 59 year old female. Plan:  I have reviewed the risks and benefits of adjuvant treatment for ductal carcinoma in situ with the patient. She is in the cautionary group according to ASTROher consensus based on her age under 22 being of pure DCIS and close margin of less than 2 mm. The ASTRO are fairly strict I still believe the patient is a good candidate for accelerated partial breast irradiation. Patient does favor this technique based on her work schedule. I have offered her accelerated partial breast irradiation to 3400 cGy. I discussed the case personally with Dr. Emilee Hero. who will attempt to place the MammoSite catheter.if catheter Replaced secondary to skin proximity or failure to enter the lumpectomy cavity I have discussed going ahead with whole breast radiation. Patient also be a candidate for tamoxifen therapy after completion of radiation. Risks and benefits of treatment  including skin reaction, thickening of the lumpectomy cavity, fatigue, all were explained in detail to the patient. She seems to [place] treatment plan well. She scheduled have a catheter placed next week.  I would like to take this opportunity to thank you for allowing me to continue to participate in this patient's care.  CC Referral:  cc: Dr. Jamal Collin, Dr. Salome Holmes   Electronic Signatures: Baruch Gouty, Roda Shutters (MD)  (Signed 20-Feb-14 10:42)  Authored: HPI, Diagnosis, Past Hx, PFSH, Allergies, Home Meds, ROS, Nursing Notes, Physical Exam, Other Results, Relevent Results, Encounter Assessment and Plan, CC Referring Physician   Last Updated: 20-Feb-14 10:42 by Armstead Peaks (MD)

## 2014-06-04 LAB — SURGICAL PATHOLOGY

## 2014-10-03 ENCOUNTER — Telehealth: Payer: Self-pay | Admitting: *Deleted

## 2014-10-03 MED ORDER — TAMOXIFEN CITRATE 20 MG PO TABS: 20.0000 mg | ORAL_TABLET | Freq: Every day | ORAL | Status: DC

## 2014-10-03 NOTE — Telephone Encounter (Signed)
Patient needs refill on tamoxifen, she uses Martha Waller

## 2014-10-03 NOTE — Telephone Encounter (Signed)
Sent electronic

## 2015-01-30 ENCOUNTER — Encounter: Payer: Self-pay | Admitting: *Deleted

## 2015-03-25 ENCOUNTER — Ambulatory Visit: Payer: BC Managed Care – PPO | Admitting: General Surgery

## 2015-04-08 ENCOUNTER — Encounter: Payer: Self-pay | Admitting: General Surgery

## 2015-04-10 ENCOUNTER — Encounter: Payer: Self-pay | Admitting: General Surgery

## 2015-04-10 ENCOUNTER — Ambulatory Visit (INDEPENDENT_AMBULATORY_CARE_PROVIDER_SITE_OTHER): Payer: BC Managed Care – PPO | Admitting: General Surgery

## 2015-04-10 VITALS — BP 130/74 | HR 78 | Resp 12 | Ht 68.0 in | Wt 280.0 lb

## 2015-04-10 DIAGNOSIS — D0512 Intraductal carcinoma in situ of left breast: Secondary | ICD-10-CM | POA: Diagnosis not present

## 2015-04-10 NOTE — Progress Notes (Signed)
Patient ID: Martha Waller, female   DOB: [birth year], 60 y.o.   MRN: [id number]  Chief Complaint  Patient presents with  . Follow-up    mammogram    HPI Martha Waller is a 60 y.o. female who presents for a breast cancer follow up. The most recent mammogram was done on .  Patient does perform regular self breast checks and gets regular mammograms done. I have reviewed the history of present illness with the patient.      HPI  Past Medical History  Diagnosis Date  . 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 Hypertension   . Mammographic microcalcification 2014  . 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 Breast complaint 2013  . Cancer [facility]) Feb 2014    DCIS, breast  . Colon polyp     Past Surgical History  Procedure Laterality Date  . Breast mammosite Left 2014  . Breast mammosite Left 2014    removal  . Colonoscopy w/ polypectomy  05/01/14    2 small benign polyps removed, done by [facility]  . Breast lumpectomy Left February 2014  . Tonsillectomy  1998  . Gastric bypass  2010    Family History  Problem Relation Age of Onset  . Cancer Maternal Grandmother 60    throat cancer    Social History Social History  Substance Use Topics  . Smoking status: Former Smoker -- 0.50 packs/day for 20 years    Types: Cigarettes  . Smokeless tobacco: Never Used  . Alcohol Use: No    No Known Allergies  Current Outpatient Prescriptions  Medication Sig Dispense Refill  . Calcium Carbonate-Vitamin D (CALCIUM 600 + D PO) Take by mouth.    . hydrochlorothiazide (HYDRODIURIL) 50 MG tablet Take 50 mg by mouth daily.    Marland Kitchen levothyroxine (SYNTHROID, LEVOTHROID) 175 MCG tablet Take 175 mcg by mouth daily before breakfast.    . lisinopril (PRINIVIL,ZESTRIL) 10 MG tablet Take 10 mg by mouth daily.    . Multiple Vitamin (MULTIVITAMIN) tablet Take 1 tablet by mouth daily.    . tamoxifen (NOLVADEX) 20 MG tablet  Take 20 mg by mouth daily.     No current facility-administered medications for this visit.    Review of Systems Review of Systems  Constitutional: Negative.   Respiratory: Negative.   Cardiovascular: Negative.     Blood pressure 130/74, pulse 78, resp. rate 12, height 5\' 8"  (1.727 m), weight 280 lb (127.007 kg).  Physical Exam Physical Exam  Constitutional: She is oriented to person, place, and time. She appears well-developed and well-nourished.  Eyes: Conjunctivae are normal. No scleral icterus.  Neck: Neck supple.  Cardiovascular: Normal rate, regular rhythm and normal heart sounds.   Pulmonary/Chest: Effort normal and breath sounds normal. Right breast exhibits no inverted nipple, no mass, no nipple discharge, no skin change and no tenderness. Left breast exhibits no inverted nipple, no mass, no nipple discharge, no skin change and no tenderness.    Abdominal: Soft. Normal appearance and bowel sounds are normal. There is no hepatomegaly. There is no tenderness. No hernia.  Lymphadenopathy:    She has no cervical adenopathy.    She has no axillary adenopathy.  Neurological: She is alert and oriented to person, place, and time.  Skin: Skin is warm and dry.    Data Reviewed Mammogram reviewed. No changes noted.   Assessment  Stable physical exam. DCIS left breast. 3 years after lumpectomy and radiation. Currently on tamoxifen and doing well  Plan    The patient has been asked to return to the office in one year with a bilateral diagnostic mammogram.      PCP:  Clide Deutscher, Ngwe A This information has been scribed by Gaspar Cola CMA.   Martha Waller 04/10/2015, 1:34 PM

## 2015-04-10 NOTE — Patient Instructions (Signed)
The patient has been asked to return to the office in one year with a bilateral diagnostic mammogram.Continue self breast exams. Call office for any new breast issues or concerns.  

## 2015-06-03 ENCOUNTER — Telehealth: Payer: Self-pay | Admitting: *Deleted

## 2015-06-03 NOTE — Telephone Encounter (Signed)
error 

## 2015-06-17 ENCOUNTER — Telehealth: Payer: Self-pay | Admitting: General Surgery

## 2015-06-17 NOTE — Telephone Encounter (Signed)
PT CALLED & STATED DR Jamal Collin HAD L/M FOR HER & STATED HE WOULD CALL HER BACK TODAY(MONDAY 06-17-15). 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 I SPOKE WITH HIM VERBALLY ON THE PHONE & HE IS AWARE OF MESSAGE ABOVE/MTH

## 2015-06-18 ENCOUNTER — Ambulatory Visit (INDEPENDENT_AMBULATORY_CARE_PROVIDER_SITE_OTHER): Payer: BC Managed Care – PPO | Admitting: General Surgery

## 2015-06-18 ENCOUNTER — Encounter: Payer: Self-pay | Admitting: General Surgery

## 2015-06-18 VITALS — BP 174/80 | HR 88 | Resp 14 | Ht 68.0 in | Wt 274.0 lb

## 2015-06-18 DIAGNOSIS — D0512 Intraductal carcinoma in situ of left breast: Secondary | ICD-10-CM

## 2015-06-18 DIAGNOSIS — S21002A Unspecified open wound of left breast, initial encounter: Secondary | ICD-10-CM

## 2015-06-18 MED ORDER — DOXYCYCLINE HYCLATE 100 MG PO CAPS: 100.0000 mg | ORAL_CAPSULE | Freq: Two times a day (BID) | ORAL | Status: DC

## 2015-06-18 NOTE — Progress Notes (Signed)
Patient ID: Martha Waller, female   DOB: 05-17-1955, 60 y.o.   MRN: [id number]  Chief Complaint  Patient presents with  . Follow-up    HPI Martha Waller is a 60 y.o. female.  Follow up left breast surgical site wound. She states the area at surgery site is just "not healing" and looks like a burn. She has been using Rawleigh salve. I have reviewed the history of present illness with the patient. HPI  Past Medical History  Diagnosis Date  . 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 Hypertension   . Mammographic microcalcification 2014  . 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 Breast complaint 2013  . Cancer [facility]) Feb 2014    DCIS, breast  . Colon polyp     Past Surgical History  Procedure Laterality Date  . Breast mammosite Left 2014  . Breast mammosite Left 2014    removal  . Colonoscopy w/ polypectomy  05/01/14    2 small benign polyps removed, done by [facility]  . Breast lumpectomy Left February 2014  . Tonsillectomy  1998  . Gastric bypass  2010    Family History  Problem Relation Age of Onset  . Cancer Maternal Grandmother 60    throat cancer    Social History Social History  Substance Use Topics  . Smoking status: Former Smoker -- 0.50 packs/day for 20 years    Types: Cigarettes  . Smokeless tobacco: Never Used  . Alcohol Use: No    No Known Allergies  Current Outpatient Prescriptions  Medication Sig Dispense Refill  . Calcium Carbonate-Vitamin D (CALCIUM 600 + D PO) Take by mouth.    . hydrochlorothiazide (HYDRODIURIL) 50 MG tablet Take 50 mg by mouth daily.    Marland Kitchen levothyroxine (SYNTHROID, LEVOTHROID) 175 MCG tablet Take 175 mcg by mouth daily before breakfast.    . lisinopril (PRINIVIL,ZESTRIL) 10 MG tablet Take 10 mg by mouth daily.    . Multiple Vitamin (MULTIVITAMIN) tablet Take 1 tablet by mouth daily.    . tamoxifen (NOLVADEX) 20 MG tablet Take 20 mg by mouth daily.      Marland Kitchen doxycycline (VIBRAMYCIN) 100 MG capsule Take 1 capsule (100 mg total) by mouth 2 (two) times daily. 20 capsule 0   No current facility-administered medications for this visit.    Review of Systems Review of Systems  Constitutional: Negative.   Respiratory: Negative.   Cardiovascular: Negative.     Blood pressure 174/80, pulse 88, resp. rate 14, height 5\' 8"  (1.727 m), weight 274 lb (124.286 kg).  Physical Exam Physical Exam  Constitutional: She is oriented to person, place, and time. She appears well-developed and well-nourished.  Pulmonary/Chest:    Neurological: She is alert and oriented to person, place, and time.  Skin: Skin is warm and dry.  Psychiatric: She has a normal mood and affect.    Data Reviewed   Assessment    Slow healing site left breast lumpectomy from mammosite entry site  Trial of 10 days doxycycline. Reassess in a month  Plan   Patient to return in one month for reevaluation     PCP:  Aycock, Ngwe A This information has been scribed by Karie Fetch RN, BSN,BC.   Arionna Hoggard G 06/20/2015, 8:08 AM

## 2015-06-18 NOTE — Patient Instructions (Signed)
The patient is aware to call back for any questions or concerns.  

## 2015-06-20 ENCOUNTER — Encounter: Payer: Self-pay | Admitting: General Surgery

## 2015-06-23 LAB — ANAEROBIC AND AEROBIC CULTURE

## 2015-06-26 ENCOUNTER — Encounter: Payer: Self-pay | Admitting: General Surgery

## 2015-07-18 ENCOUNTER — Encounter: Payer: Self-pay | Admitting: General Surgery

## 2015-07-18 ENCOUNTER — Ambulatory Visit (INDEPENDENT_AMBULATORY_CARE_PROVIDER_SITE_OTHER): Payer: BC Managed Care – PPO | Admitting: General Surgery

## 2015-07-18 VITALS — BP 154/90 | HR 82 | Resp 12 | Ht 68.0 in | Wt 273.0 lb

## 2015-07-18 DIAGNOSIS — D0512 Intraductal carcinoma in situ of left breast: Secondary | ICD-10-CM

## 2015-07-18 NOTE — Progress Notes (Signed)
Patient ID: Martha Waller, female   DOB: November 19, 1955, 60 y.o.   MRN: [id number]   Follow up left breast surgical site wound. She has completed the antibiotics. She states the area is improved. She has not been using Rawleigh salve. I have reviewed the history of present illness with the patient.  The area has left a scab that is 6-7 mm left breast, no induration or tenderness. Site has greatly improved from last visit.   Patient to begin using Rawleigh salve on site.  Follow up as scheduled. The patient is aware to call back for any questions or concerns.  PCP:  Tomasa Hose A This information has been scribed by Karie Fetch RN, BSN,BC.

## 2015-07-18 NOTE — Patient Instructions (Signed)
The patient is aware to call back for any questions or concerns.  

## 2016-01-22 ENCOUNTER — Other Ambulatory Visit: Payer: Self-pay

## 2016-01-22 DIAGNOSIS — D0512 Intraductal carcinoma in situ of left breast: Secondary | ICD-10-CM

## 2016-04-07 ENCOUNTER — Ambulatory Visit
Admission: RE | Admit: 2016-04-07 | Discharge: 2016-04-07 | Disposition: A | Discharge status: Home / Self Care | Payer: BC Managed Care – PPO | Source: Ambulatory Visit | Attending: General Surgery | Admitting: General Surgery

## 2016-04-07 ENCOUNTER — Encounter: Payer: Self-pay | Admitting: *Deleted

## 2016-04-07 DIAGNOSIS — D0512 Intraductal carcinoma in situ of left breast: Secondary | ICD-10-CM | POA: Diagnosis not present

## 2016-04-13 ENCOUNTER — Encounter: Payer: Self-pay | Admitting: General Surgery

## 2016-04-13 ENCOUNTER — Ambulatory Visit (INDEPENDENT_AMBULATORY_CARE_PROVIDER_SITE_OTHER): Payer: BC Managed Care – PPO | Admitting: General Surgery

## 2016-04-13 VITALS — BP 136/80 | HR 82 | Resp 12 | Ht 68.0 in | Wt 280.0 lb

## 2016-04-13 DIAGNOSIS — Z86010 Personal history of colonic polyps: Secondary | ICD-10-CM | POA: Diagnosis not present

## 2016-04-13 DIAGNOSIS — D0512 Intraductal carcinoma in situ of left breast: Secondary | ICD-10-CM

## 2016-04-13 NOTE — Patient Instructions (Signed)
The patient has been asked to return to the office in one year with a bilateral diagnostic mammogram with Dr. Byrnett. 

## 2016-04-13 NOTE — Progress Notes (Signed)
Patient ID: DARNAE BERNAT, female   DOB: 1956-02-05, 61 y.o.   MRN: [id number]  Chief Complaint  Patient presents with  . Follow-up    HPI Martha Waller is a 61 y.o. female who presents for a breast cancer follow up. Patient diagnosed with DCIS of the left breast, treated with a lumpectomy in February, 2014 and subsequent mammosite radiation. The most recent mammogram was done on 04/07/2016. Patient is doing well on the Tamoxifen, complains only of increased sweating.   Patient does perform regular self breast checks and gets regular mammograms done. I have reviewed the history of present illness with the patient.  HPI  Past Medical History:  Diagnosis Date  . Breast complaint 2013  . Cancer [facility]) Feb 2014   DCIS, left breast with lumpectomy and rad tx  . 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 Colon polyp   . Diabetes mellitus without complication ([place]) [id number]   type 2, resolved since gastric bypass surgery  . Hypertension   . Mammographic microcalcification 2014    Past Surgical History:  Procedure Laterality Date  . BREAST BIOPSY Left 2014   DCIS  . BREAST LUMPECTOMY Left February 2014  . BREAST MAMMOSITE Left 2014  . BREAST MAMMOSITE Left 2014   removal  . COLONOSCOPY W/ POLYPECTOMY  05/01/14   2 small benign polyps removed, done by [facility]  2010  . TONSILLECTOMY  1998    Family History  Problem Relation Age of Onset  . Cancer Maternal Grandmother 60    throat cancer    Social History Social History  Substance Use Topics  . Smoking status: Former Smoker    Packs/day: 0.50    Years: 20.00    Types: Cigarettes  . Smokeless tobacco: Never Used  . Alcohol use No    No Known Allergies  Current Outpatient Prescriptions  Medication Sig Dispense Refill  . Calcium Carbonate-Vitamin D (CALCIUM 600 + D PO) Take by mouth.    . hydrochlorothiazide (HYDRODIURIL) 50 MG tablet Take 50 mg by mouth daily.    Marland Kitchen levothyroxine (SYNTHROID,  LEVOTHROID) 175 MCG tablet Take 175 mcg by mouth daily before breakfast.    . lisinopril (PRINIVIL,ZESTRIL) 10 MG tablet Take 10 mg by mouth daily.    . Multiple Vitamin (MULTIVITAMIN) tablet Take 1 tablet by mouth daily.    . tamoxifen (NOLVADEX) 20 MG tablet Take 20 mg by mouth daily.     No current facility-administered medications for this visit.     Review of Systems Review of Systems  Constitutional: Negative.   Respiratory: Negative.   Cardiovascular: Negative.     Blood pressure 136/80, pulse 82, resp. rate 12, height 5\' 8"  (1.727 m), weight 280 lb (127 kg).  Physical Exam Physical Exam  Constitutional: She is oriented to person, place, and time. She appears well-developed and well-nourished.  Eyes: Conjunctivae are normal. No scleral icterus.  Neck: Neck supple.  Cardiovascular: Normal rate, regular rhythm and normal heart sounds.   Pulmonary/Chest: Effort normal and breath sounds normal. Right breast exhibits no inverted nipple, no mass, no nipple discharge, no skin change and no tenderness. Left breast exhibits no inverted nipple, no mass, no nipple discharge, no skin change and no tenderness.  Small radiation scab at 12ocl on lumpectomy site   Abdominal: Soft. Bowel sounds are normal.  Lymphadenopathy:    She has no cervical adenopathy.    She has no axillary adenopathy.  Neurological: She  is alert and oriented to person, place, and time.  Skin: Skin is warm and dry.    Data Reviewed Mammogram reviewed   Assessment  DCIS left breast, completed 4 years of Tamoxifen History of colon polyps. Colonoscopy due 2021.  Plan    The patient has been asked to return to the office in one year with a bilateral diagnostic mammogram with Dr. Bary Castilla .    This information has been scribed by Gaspar Cola CMA.    SANKAR,SEEPLAPUTHUR G 04/13/2016, 4:15 PM

## 2017-03-10 ENCOUNTER — Other Ambulatory Visit: Payer: Self-pay

## 2017-03-10 DIAGNOSIS — D0512 Intraductal carcinoma in situ of left breast: Secondary | ICD-10-CM

## 2017-03-15 ENCOUNTER — Encounter: Payer: Self-pay | Admitting: *Deleted

## 2017-04-15 ENCOUNTER — Ambulatory Visit
Admission: RE | Admit: 2017-04-15 | Discharge: 2017-04-15 | Disposition: A | Discharge status: Home / Self Care | Payer: BC Managed Care – PPO | Source: Ambulatory Visit | Attending: General Surgery | Admitting: General Surgery

## 2017-04-15 DIAGNOSIS — D0512 Intraductal carcinoma in situ of left breast: Secondary | ICD-10-CM | POA: Diagnosis present

## 2017-04-15 HISTORY — DX: Malignant neoplasm of unspecified site of unspecified female breast: C50.919

## 2017-04-27 ENCOUNTER — Ambulatory Visit: Payer: BC Managed Care – PPO | Admitting: General Surgery

## 2017-05-13 ENCOUNTER — Ambulatory Visit: Payer: BC Managed Care – PPO | Admitting: General Surgery

## 2017-05-25 ENCOUNTER — Ambulatory Visit: Payer: BC Managed Care – PPO | Admitting: General Surgery

## 2017-06-03 ENCOUNTER — Encounter: Payer: Self-pay | Admitting: General Surgery

## 2017-06-03 ENCOUNTER — Ambulatory Visit (INDEPENDENT_AMBULATORY_CARE_PROVIDER_SITE_OTHER): Payer: BC Managed Care – PPO | Admitting: General Surgery

## 2017-06-03 VITALS — BP 130/74 | HR 74 | Resp 14 | Ht 67.0 in | Wt 276.0 lb

## 2017-06-03 DIAGNOSIS — D0512 Intraductal carcinoma in situ of left breast: Secondary | ICD-10-CM

## 2017-06-03 NOTE — Progress Notes (Signed)
Patient ID: Martha Waller, female   DOB: 1955-04-26, 62 y.o.   MRN: [id number]  Chief Complaint  Patient presents with  . Follow-up    HPI Martha Waller is a 62 y.o. female who presents for a breast evaluation. The most recent mammogram was done on 04/15/2017 .  Patient does perform regular self breast checks and gets regular mammograms done.    HPI  Past Medical History:  Diagnosis Date  . 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 Breast complaint 2013  . 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 Colon polyp   . Diabetes mellitus without complication ([place]) 3762   type 2, resolved since gastric bypass surgery  . Hypertension   . Mammographic microcalcification 2014    Past Surgical History:  Procedure Laterality Date  . BREAST BIOPSY Left 2014   DCIS  . BREAST LUMPECTOMY Left February 2014   F/U Radiation   . BREAST MAMMOSITE Left 2014  . BREAST MAMMOSITE Left 2014   removal  . COLONOSCOPY W/ POLYPECTOMY  05/01/14   2 small benign polyps removed, done by [place]  2010  . TONSILLECTOMY  1998    Family History  Problem Relation Age of Onset  . Cancer Maternal Grandmother 60       throat cancer    Social History Social History   Tobacco Use  . Smoking status: Former Smoker    Packs/day: 0.50    Years: 20.00    Pack years: 10.00    Types: Cigarettes  . Smokeless tobacco: Never Used  Substance Use Topics  . Alcohol use: No  . Drug use: No    No Known Allergies  Current Outpatient Medications  Medication Sig Dispense Refill  . Calcium Carbonate-Vitamin D (CALCIUM 600 + D PO) Take by mouth.    . hydrochlorothiazide (HYDRODIURIL) 50 MG tablet Take 50 mg by mouth daily.    Marland Kitchen levothyroxine (SYNTHROID, LEVOTHROID) 175 MCG tablet Take 175 mcg by mouth daily before breakfast.    . lisinopril (PRINIVIL,ZESTRIL) 10 MG tablet Take 10 mg by mouth daily.    . Multiple Vitamin (MULTIVITAMIN) tablet  Take 1 tablet by mouth daily.    . tamoxifen (NOLVADEX) 20 MG tablet Take 20 mg by mouth daily.     No current facility-administered medications for this visit.     Review of Systems Review of Systems  Constitutional: Negative.   Respiratory: Negative.   Cardiovascular: Negative.     Blood pressure 130/74, pulse 74, resp. rate 14, height 5\' 7"  (1.702 m), weight 276 lb (125.2 kg).  Physical Exam Physical Exam  Constitutional: She is oriented to person, place, and time. She appears well-developed and well-nourished.  Eyes: Conjunctivae are normal. No scleral icterus.  Neck: Neck supple.  Cardiovascular: Normal rate, regular rhythm and normal heart sounds.  Pulmonary/Chest: Effort normal and breath sounds normal. Right breast exhibits no inverted nipple, no mass, no nipple discharge, no skin change and no tenderness. Left breast exhibits no inverted nipple, no mass, no nipple discharge, no skin change and no tenderness.  Lymphadenopathy:    She has no cervical adenopathy.    She has no axillary adenopathy.  Neurological: She is alert and oriented to person, place, and time.  Skin: Skin is warm and dry.    Data Reviewed Bilateral diagnostic mammograms dated April 15, 2017 were reviewed.  Minimal scarring at the site  of previous wide excision.  BI-RADS-2.  Assessment    Doing well now 5 years out post breast conservation for DCIS.    Plan  The patient has been asked to return to the office in one year with a bilateral screening mammogram.   May stop Tamoxifen when present supply exhausted..   The patient is aware to call back for any questions or concerns.   HPI, Physical Exam, Assessment and Plan have been scribed under the direction and in the presence of Martha Ard, MD.  Martha Waller, CMA  I have completed the exam and reviewed the above documentation for accuracy and completeness.  I agree with the above.  Haematologist has been used and any errors in  dictation or transcription are unintentional.  Martha Waller, M.D., F.A.C.S. Martha Waller Martha Waller 06/03/2017, 1:17 PM

## 2017-06-03 NOTE — Patient Instructions (Signed)
The patient has been asked to return to the office in one year with a bilateral diagnostic mammogram.The patient is aware to call back for any questions or concerns. 

## 2018-04-13 ENCOUNTER — Other Ambulatory Visit: Payer: Self-pay

## 2018-04-13 DIAGNOSIS — D0512 Intraductal carcinoma in situ of left breast: Secondary | ICD-10-CM

## 2018-04-28 ENCOUNTER — Other Ambulatory Visit: Payer: Self-pay

## 2018-04-28 DIAGNOSIS — Z1231 Encounter for screening mammogram for malignant neoplasm of breast: Secondary | ICD-10-CM

## 2018-06-28 ENCOUNTER — Ambulatory Visit: Payer: BC Managed Care – PPO | Admitting: General Surgery

## 2018-07-18 ENCOUNTER — Other Ambulatory Visit: Payer: Self-pay

## 2018-07-18 ENCOUNTER — Ambulatory Visit
Admission: RE | Admit: 2018-07-18 | Discharge: 2018-07-18 | Disposition: A | Discharge status: Home / Self Care | Payer: BC Managed Care – PPO | Source: Ambulatory Visit | Attending: General Surgery | Admitting: General Surgery

## 2018-07-18 DIAGNOSIS — Z1231 Encounter for screening mammogram for malignant neoplasm of breast: Secondary | ICD-10-CM | POA: Diagnosis present

## 2018-07-21 ENCOUNTER — Other Ambulatory Visit: Payer: Self-pay

## 2018-07-21 ENCOUNTER — Encounter: Payer: Self-pay | Admitting: General Surgery

## 2018-07-21 ENCOUNTER — Ambulatory Visit (INDEPENDENT_AMBULATORY_CARE_PROVIDER_SITE_OTHER): Payer: BC Managed Care – PPO | Admitting: General Surgery

## 2018-07-21 VITALS — BP 126/83 | HR 85 | Temp 96.3°F | Resp 16 | Ht 68.0 in | Wt 276.6 lb

## 2018-07-21 DIAGNOSIS — Z1231 Encounter for screening mammogram for malignant neoplasm of breast: Secondary | ICD-10-CM

## 2018-07-21 NOTE — Patient Instructions (Addendum)
The patient is aware to call back for any questions or new concerns. Patient will be asked to return to the office in one year with a bilateral screening mammogram.  She can call after her mammograms if she was concerned and wanted to know the report prior to her appointment.

## 2018-07-21 NOTE — Progress Notes (Signed)
Patient ID: Martha Waller, female   DOB: May 01, 1955, 63 y.o.   MRN: [id number]  Chief Complaint  Patient presents with  . Follow-up    ne year f/u recall bil screen mammo    HPI Martha Waller is a 63 y.o. female.  Here for follow up left breast cancer. Mammogram was 07-18-18, no new issues. She completed 5 years Tamoxifen.  HPI  Past Medical History:  Diagnosis Date  . 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 left breast with lumpectomy and Mammosite radiation.   . Breast complaint 2013  . 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 Colon polyp   . Diabetes mellitus without complication ([place]) 4403   type 2, resolved since gastric bypass surgery  . Hypertension   . Mammographic microcalcification 2014    Past Surgical History:  Procedure Laterality Date  . BREAST BIOPSY Left 2014   DCIS  . BREAST LUMPECTOMY Left February 2014   F/U Radiation   . BREAST MAMMOSITE Left 2014  . BREAST MAMMOSITE Left 2014   removal  . COLONOSCOPY W/ POLYPECTOMY  05/01/14   2 small benign polyps removed, done by [place]  2010  . TONSILLECTOMY  1998    Family History  Problem Relation Age of Onset  . Cancer Maternal Grandmother 60       throat cancer    Social History Social History   Tobacco Use  . Smoking status: Former Smoker    Packs/day: 0.50    Years: 20.00    Pack years: 10.00    Types: Cigarettes  . Smokeless tobacco: Never Used  Substance Use Topics  . Alcohol use: No  . Drug use: No    No Known Allergies  Current Outpatient Medications  Medication Sig Dispense Refill  . Calcium Carbonate-Vitamin D (CALCIUM 600 + D PO) Take by mouth.    . hydrochlorothiazide (HYDRODIURIL) 50 MG tablet Take 50 mg by mouth daily.    Marland Kitchen levothyroxine (SYNTHROID, LEVOTHROID) 175 MCG tablet Take 175 mcg by mouth daily before breakfast.    . lisinopril (PRINIVIL,ZESTRIL) 10 MG tablet Take 10 mg by mouth daily.    .  Multiple Vitamin (MULTIVITAMIN) tablet Take 1 tablet by mouth daily.     No current facility-administered medications for this visit.     Review of Systems Review of Systems  Constitutional: Negative.   Respiratory: Negative.   Cardiovascular: Negative.     Blood pressure 126/83, pulse 85, temperature (!) 96.3 F (35.7 C), temperature source Temporal, resp. rate 16, height 5\' 8"  (1.727 m), weight 276 lb 9.6 oz (125.5 kg), SpO2 98 %.  Physical Exam Physical Exam Exam conducted with a chaperone present.  Constitutional:      Appearance: She is well-developed.  Eyes:     General: No scleral icterus.    Conjunctiva/sclera: Conjunctivae normal.  Neck:     Musculoskeletal: Neck supple.  Cardiovascular:     Rate and Rhythm: Normal rate and regular rhythm.     Heart sounds: Normal heart sounds.  Pulmonary:     Effort: Pulmonary effort is normal.     Breath sounds: Normal breath sounds.  Chest:     Breasts:        Right: No inverted nipple, mass, nipple discharge, skin change or tenderness.        Left: No inverted nipple, mass, nipple discharge, skin change or tenderness.  Comments: Left breast lumpectomy  Lymphadenopathy:     Cervical: No cervical adenopathy.  Skin:    General: Skin is warm and dry.  Neurological:     Mental Status: She is alert and oriented to person, place, and time.  Psychiatric:        Behavior: Behavior normal.     Data Reviewed Bilateral screening mammograms dated July 18, 2018 were independently reviewed.  BI-RADS-1.  Assessment Benign breast exam status post wide excision and MammoSite radiation.  Plan  She can call after her mammograms if she was concerned and wanted to know the report prior to her appointment. The patient is aware to call back for any questions or new concerns. Patient will be asked to return to the office in one year with a bilateral screening mammogram.  HPI, assessment, plan and physical exam has been scribed  under the direction and in the presence of Robert Bellow, MD. Karie Fetch, RN   HPI, assessment, plan and physical exam has been scribed under the direction and in the presence of Robert Bellow, MD. Karie Fetch, RN  I have completed the exam and reviewed the above documentation for accuracy and completeness.  I agree with the above.  Haematologist has been used and any errors in dictation or transcription are unintentional.  Hervey Ard, M.D., F.A.C.S.   Forest Gleason Kutler Vanvranken 07/24/2018, 3:08 PM

## 2018-07-24 ENCOUNTER — Encounter: Payer: Self-pay | Admitting: General Surgery

## 2018-08-12 ENCOUNTER — Other Ambulatory Visit: Payer: Self-pay

## 2018-08-12 ENCOUNTER — Encounter: Payer: Self-pay | Admitting: Emergency Medicine

## 2018-08-12 DIAGNOSIS — M5412 Radiculopathy, cervical region: Secondary | ICD-10-CM | POA: Insufficient documentation

## 2018-08-12 DIAGNOSIS — Z87891 Personal history of nicotine dependence: Secondary | ICD-10-CM | POA: Insufficient documentation

## 2018-08-12 DIAGNOSIS — I10 Essential (primary) hypertension: Secondary | ICD-10-CM | POA: Diagnosis not present

## 2018-08-12 DIAGNOSIS — Z79899 Other long term (current) drug therapy: Secondary | ICD-10-CM | POA: Diagnosis not present

## 2018-08-12 DIAGNOSIS — Z853 Personal history of malignant neoplasm of breast: Secondary | ICD-10-CM | POA: Insufficient documentation

## 2018-08-12 DIAGNOSIS — E119 Type 2 diabetes mellitus without complications: Secondary | ICD-10-CM | POA: Insufficient documentation

## 2018-08-12 DIAGNOSIS — M79602 Pain in left arm: Secondary | ICD-10-CM | POA: Diagnosis present

## 2018-08-12 NOTE — ED Triage Notes (Signed)
Patient with complaint of chronic left arm nerve pain that became worse last night.

## 2018-08-13 ENCOUNTER — Emergency Department
Admission: EM | Admit: 2018-08-13 | Discharge: 2018-08-13 | Disposition: A | Discharge status: Home / Self Care | Payer: BC Managed Care – PPO | Attending: Emergency Medicine | Admitting: Emergency Medicine

## 2018-08-13 DIAGNOSIS — M5412 Radiculopathy, cervical region: Secondary | ICD-10-CM

## 2018-08-13 MED ORDER — METHYLPREDNISOLONE 4 MG PO TBPK: ORAL_TABLET | ORAL | 0 refills | Status: DC

## 2018-08-13 MED ORDER — PREDNISONE 20 MG PO TABS
30.0000 mg | ORAL_TABLET | Freq: Once | ORAL | Status: AC
  Administered 2018-08-13: 02:00:00 30 mg via ORAL
  Filled 2018-08-13: qty 1

## 2018-08-13 MED ORDER — OXYCODONE-ACETAMINOPHEN 5-325 MG PO TABS: 1.0000 | ORAL_TABLET | ORAL | 0 refills | Status: DC | PRN

## 2018-08-13 MED ORDER — DIAZEPAM 2 MG PO TABS: 2.0000 mg | ORAL_TABLET | Freq: Three times a day (TID) | ORAL | 0 refills | Status: DC | PRN

## 2018-08-13 MED ORDER — OXYCODONE-ACETAMINOPHEN 5-325 MG PO TABS
1.0000 | ORAL_TABLET | Freq: Once | ORAL | Status: AC
  Administered 2018-08-13: 02:00:00 1 via ORAL
  Filled 2018-08-13: qty 1

## 2018-08-13 MED ORDER — DIAZEPAM 2 MG PO TABS
2.0000 mg | ORAL_TABLET | Freq: Once | ORAL | Status: AC
  Administered 2018-08-13: 2 mg via ORAL
  Filled 2018-08-13: qty 1

## 2018-08-13 NOTE — Discharge Instructions (Signed)
Wear sling as needed for comfort. You may take medicines as needed for pain & muscle spasms (Percocet/Valium #15). Take steroid taper as prescribed. Return to the ER for worsening symptoms, persistent vomiting, difficulty breathing or other concerns.
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
## 2018-08-24 ENCOUNTER — Other Ambulatory Visit: Payer: Self-pay | Admitting: Physical Medicine and Rehabilitation

## 2018-08-24 DIAGNOSIS — M5412 Radiculopathy, cervical region: Secondary | ICD-10-CM

## 2018-09-08 ENCOUNTER — Encounter: Payer: Self-pay | Admitting: General Surgery

## 2018-09-10 ENCOUNTER — Other Ambulatory Visit: Payer: Self-pay

## 2018-09-10 ENCOUNTER — Ambulatory Visit
Admission: RE | Admit: 2018-09-10 | Discharge: 2018-09-10 | Disposition: A | Discharge status: Home / Self Care | Payer: BC Managed Care – PPO | Source: Ambulatory Visit | Attending: Physical Medicine and Rehabilitation | Admitting: Physical Medicine and Rehabilitation

## 2018-09-10 DIAGNOSIS — M5412 Radiculopathy, cervical region: Secondary | ICD-10-CM

## 2019-02-24 ENCOUNTER — Other Ambulatory Visit: Payer: Self-pay

## 2019-02-24 DIAGNOSIS — Z1211 Encounter for screening for malignant neoplasm of colon: Secondary | ICD-10-CM

## 2019-03-07 ENCOUNTER — Encounter: Payer: Self-pay | Admitting: *Deleted

## 2019-03-08 ENCOUNTER — Other Ambulatory Visit: Payer: Self-pay

## 2019-03-08 ENCOUNTER — Telehealth: Payer: Self-pay

## 2019-03-08 DIAGNOSIS — Z1211 Encounter for screening for malignant neoplasm of colon: Secondary | ICD-10-CM

## 2019-03-08 NOTE — Telephone Encounter (Signed)
Gastroenterology Pre-Procedure Review  Request Date: Tuesday March 2nd Requesting Physician: Dr. Allen Norris  PATIENT REVIEW QUESTIONS: The patient responded to the following health history questions as indicated:    1. Are you having any GI issues? no 2. Do you have a personal history of Polyps? yes (5 years ago) 3. Do you have a family history of Colon Cancer or Polyps? no 4. Diabetes Mellitus? no 5. Joint replacements in the past 12 months?no 6. Major health problems in the past 3 months?no 7. Any artificial heart valves, MVP, or defibrillator?no    MEDICATIONS & ALLERGIES:    Patient reports the following regarding taking any anticoagulation/antiplatelet therapy:   Plavix, Coumadin, Eliquis, Xarelto, Lovenox, Pradaxa, Brilinta, or Effient? no Aspirin? no  Patient confirms/reports the following medications:  Current Outpatient Medications  Medication Sig Dispense Refill  . Calcium Carbonate-Vitamin D (CALCIUM 600 + D PO) Take by mouth.    . diazepam (VALIUM) 2 MG tablet Take 1 tablet (2 mg total) by mouth every 8 (eight) hours as needed for muscle spasms. 15 tablet 0  . hydrochlorothiazide (HYDRODIURIL) 50 MG tablet Take 50 mg by mouth daily.    Marland Kitchen levothyroxine (SYNTHROID, LEVOTHROID) 175 MCG tablet Take 175 mcg by mouth daily before breakfast.    . lisinopril (PRINIVIL,ZESTRIL) 10 MG tablet Take 10 mg by mouth daily.    . methylPREDNISolone (MEDROL DOSEPAK) 4 MG TBPK tablet Take as directed 21 tablet 0  . Multiple Vitamin (MULTIVITAMIN) tablet Take 1 tablet by mouth daily.    Marland Kitchen oxyCODONE-acetaminophen (PERCOCET/ROXICET) 5-325 MG tablet Take 1 tablet by mouth every 4 (four) hours as needed for severe pain. 15 tablet 0   No current facility-administered medications for this visit.    Patient confirms/reports the following allergies:  No Known Allergies  No orders of the defined types were placed in this encounter.   AUTHORIZATION INFORMATION Primary Insurance: 1D#: Group  #:  Secondary Insurance: 1D#: Group #:  SCHEDULE INFORMATION: Date: Tuesday March 2nd [place] Time: Location:[facility]

## 2019-04-07 ENCOUNTER — Other Ambulatory Visit
Admission: RE | Admit: 2019-04-07 | Discharge: 2019-04-07 | Disposition: A | Discharge status: Home / Self Care | Payer: BC Managed Care – PPO | Source: Ambulatory Visit | Attending: Gastroenterology | Admitting: Gastroenterology

## 2019-04-07 ENCOUNTER — Other Ambulatory Visit: Payer: Self-pay

## 2019-04-07 DIAGNOSIS — Z20822 Contact with and (suspected) exposure to covid-19: Secondary | ICD-10-CM | POA: Diagnosis not present

## 2019-04-07 DIAGNOSIS — Z01812 Encounter for preprocedural laboratory examination: Secondary | ICD-10-CM | POA: Insufficient documentation

## 2019-04-07 LAB — SARS CORONAVIRUS 2 (TAT 6-24 HRS): SARS Coronavirus 2: NEGATIVE

## 2019-04-08 ENCOUNTER — Ambulatory Visit: Payer: BC Managed Care – PPO | Attending: Internal Medicine

## 2019-04-08 DIAGNOSIS — Z23 Encounter for immunization: Secondary | ICD-10-CM | POA: Insufficient documentation

## 2019-04-08 NOTE — Progress Notes (Signed)
   Covid-19 Vaccination Clinic  Name:  Martha Waller    MRN: [id number] DOB: [birth year]  04/08/2019  Martha Waller was observed post Covid-19 immunization for 15 minutes without incidence. She was provided with Vaccine Information Sheet and instruction to access the V-Safe system.   Martha Waller was instructed to call 911 with any severe reactions post vaccine: Marland Kitchen Difficulty breathing  . Swelling of your face and throat  . A fast heartbeat  . A bad rash all over your body  . Dizziness and weakness    Immunizations Administered    Name Date Dose VIS Date Route   Moderna COVID-19 Vaccine 04/08/2019 12:50 PM 0.5 mL 01/10/2019 Intramuscular   Manufacturer: Moderna   Lot: [id number]   [id number]

## 2019-04-10 ENCOUNTER — Other Ambulatory Visit: Payer: Self-pay

## 2019-04-10 ENCOUNTER — Other Ambulatory Visit: Payer: Self-pay | Admitting: Gastroenterology

## 2019-04-10 ENCOUNTER — Telehealth: Payer: Self-pay

## 2019-04-10 MED ORDER — PEG 3350-KCL-NA BICARB-NACL 420 G PO SOLR: ORAL | 0 refills | Status: AC

## 2019-04-10 NOTE — Telephone Encounter (Signed)
[place] Instructions provided verbally and patient handout with instructions provided.  Patient verbalized understanding of her instructions for tomorrows colonoscopy.  Thanks,  [place], [place]

## 2019-04-11 ENCOUNTER — Encounter: Payer: Self-pay | Admitting: Gastroenterology

## 2019-04-11 ENCOUNTER — Ambulatory Visit: Payer: BC Managed Care – PPO

## 2019-04-11 ENCOUNTER — Ambulatory Visit
Admission: RE | Admit: 2019-04-11 | Discharge: 2019-04-11 | Disposition: A | Discharge status: Home / Self Care | Payer: BC Managed Care – PPO | Attending: Gastroenterology | Admitting: Gastroenterology

## 2019-04-11 ENCOUNTER — Encounter
Admission: RE | Disposition: A | Discharge status: Home / Self Care | Payer: Self-pay | Source: Home / Self Care | Attending: Gastroenterology

## 2019-04-11 DIAGNOSIS — Z79899 Other long term (current) drug therapy: Secondary | ICD-10-CM | POA: Diagnosis not present

## 2019-04-11 DIAGNOSIS — Z6839 Body mass index (BMI) 39.0-39.9, adult: Secondary | ICD-10-CM | POA: Insufficient documentation

## 2019-04-11 DIAGNOSIS — K621 Rectal polyp: Secondary | ICD-10-CM | POA: Diagnosis not present

## 2019-04-11 DIAGNOSIS — Z860100 Personal history of colon polyps, unspecified: Secondary | ICD-10-CM

## 2019-04-11 DIAGNOSIS — I10 Essential (primary) hypertension: Secondary | ICD-10-CM | POA: Diagnosis not present

## 2019-04-11 DIAGNOSIS — K635 Polyp of colon: Secondary | ICD-10-CM

## 2019-04-11 DIAGNOSIS — Z9884 Bariatric surgery status: Secondary | ICD-10-CM | POA: Diagnosis not present

## 2019-04-11 DIAGNOSIS — Z79890 Hormone replacement therapy (postmenopausal): Secondary | ICD-10-CM | POA: Insufficient documentation

## 2019-04-11 DIAGNOSIS — K5730 Diverticulosis of large intestine without perforation or abscess without bleeding: Secondary | ICD-10-CM | POA: Insufficient documentation

## 2019-04-11 DIAGNOSIS — Z87891 Personal history of nicotine dependence: Secondary | ICD-10-CM | POA: Insufficient documentation

## 2019-04-11 DIAGNOSIS — Z853 Personal history of malignant neoplasm of breast: Secondary | ICD-10-CM | POA: Diagnosis not present

## 2019-04-11 DIAGNOSIS — Z1211 Encounter for screening for malignant neoplasm of colon: Secondary | ICD-10-CM | POA: Insufficient documentation

## 2019-04-11 HISTORY — PX: COLONOSCOPY WITH PROPOFOL: SHX5780

## 2019-04-11 SURGERY — COLONOSCOPY WITH PROPOFOL
Anesthesia: General

## 2019-04-11 MED ORDER — SODIUM CHLORIDE 0.9 % IV SOLN: INTRAVENOUS | Status: DC

## 2019-04-11 MED ORDER — PROPOFOL 10 MG/ML IV BOLUS
INTRAVENOUS | Status: DC | PRN
  Administered 2019-04-11: 20 mg via INTRAVENOUS
  Administered 2019-04-11: 60 mg via INTRAVENOUS
  Administered 2019-04-11: 20 mg via INTRAVENOUS

## 2019-04-11 MED ORDER — PROPOFOL 500 MG/50ML IV EMUL
INTRAVENOUS | Status: DC | PRN
  Administered 2019-04-11: 125 ug/kg/min via INTRAVENOUS

## 2019-04-11 NOTE — H&P (Signed)
[name], MD [address] [place], [place] [address] [phone] Fax : [phone]  Primary Care Physician:  Donnie Coffin, MD Primary Gastroenterologist:  Dr. Allen Norris  Pre-Procedure History & Physical: HPI:  Martha Waller is a 64 y.o. female is here for an colonoscopy.   Past Medical History:  Diagnosis Date  . 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 left breast with lumpectomy and Mammosite radiation.   . Breast complaint 2013  . 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 Colon polyp   . 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 Hypertension   . Mammographic microcalcification 2014    Past Surgical History:  Procedure Laterality Date  . BREAST BIOPSY Left 2014   DCIS  . BREAST LUMPECTOMY Left February 2014   F/U Radiation   . BREAST MAMMOSITE Left 2014  . BREAST MAMMOSITE Left 2014   removal  . COLONOSCOPY W/ POLYPECTOMY  05/01/14   2 small benign polyps removed, done by [facility]  2010  . TONSILLECTOMY  1998    Prior to Admission medications   Medication Sig Start Date End Date Taking? Authorizing Provider  hydrochlorothiazide (HYDRODIURIL) 50 MG tablet Take 50 mg by mouth daily.   Yes [provider]  levothyroxine (SYNTHROID, LEVOTHROID) 175 MCG tablet Take 175 mcg by mouth daily before breakfast.   Yes [provider]  lisinopril (PRINIVIL,ZESTRIL) 10 MG tablet Take 10 mg by mouth daily.   Yes [provider]  Multiple Vitamin (MULTIVITAMIN) tablet Take 1 tablet by mouth daily.   Yes [provider]  Calcium Carbonate-Vitamin D (CALCIUM 600 + D PO) Take by mouth.    [provider]  diazepam (VALIUM) 2 MG tablet Take 1 tablet (2 mg total) by mouth every 8 (eight) hours as needed for muscle spasms. Patient not taking: Reported on 04/11/2019 08/13/18   Paulette Blanch, MD    methylPREDNISolone (MEDROL DOSEPAK) 4 MG TBPK tablet Take as directed Patient not taking: Reported on 04/11/2019 08/13/18   Paulette Blanch, MD  oxyCODONE-acetaminophen (PERCOCET/ROXICET) 5-325 MG tablet Take 1 tablet by mouth every 4 (four) hours as needed for severe pain. 08/13/18   Paulette Blanch, MD  polyethylene glycol-electrolytes (NULYTELY) 420 g solution Drink one 8 oz glass every 20 mins until entire container is finished starting at 5:00pm today. 04/10/19   [name], MD    Allergies as of 03/08/2019  . (No Known Allergies)    Family History  Problem Relation Age of Onset  . Cancer Maternal Grandmother 60       throat cancer    Social History   Socioeconomic History  . Marital status: Widowed    Spouse name: Not on file  . Number of children: Not on file  . Years of education: Not on file  . Highest education level: Not on file  Occupational History  . Not on file  Tobacco Use  . Smoking status: Former Smoker    Packs/day: 0.50    Years: 20.00    Pack years: 10.00    Types: Cigarettes  . Smokeless tobacco: Never Used  Substance and Sexual Activity  . Alcohol use: No  . Drug use: No  . Sexual activity: Not on file  Other Topics Concern  . Not on file  Social History Narrative  . Not  on file   Social Determinants of Health   Financial Resource Strain:   . Difficulty of Paying Living Expenses: Not on file  Food Insecurity:   . Worried About Charity fundraiser in the Last Year: Not on file  . Ran Out of Food in the Last Year: Not on file  Transportation Needs:   . Lack of Transportation (Medical): Not on file  . Lack of Transportation (Non-Medical): Not on file  Physical Activity:   . Days of Exercise per Week: Not on file  . Minutes of Exercise per Session: Not on file  Stress:   . Feeling of Stress : Not on file  Social Connections:   . Frequency of Communication with Friends and Family: Not on file  . Frequency of Social Gatherings with Friends and Family:  Not on file  . Attends Religious Services: Not on file  . Active Member of Clubs or Organizations: Not on file  . Attends Archivist Meetings: Not on file  . Marital Status: Not on file  Intimate Partner Violence:   . Fear of Current or Ex-Partner: Not on file  . Emotionally Abused: Not on file  . Physically Abused: Not on file  . Sexually Abused: Not on file    Review of Systems: See HPI, otherwise negative ROS  Physical Exam: BP (!) 142/101   Pulse 75   Temp (!) 96.5 F (35.8 C)   Resp 20   Ht 5\' 8"  (1.727 m)   Wt 117 kg   SpO2 98%   BMI 39.23 kg/m  General:   Alert,  pleasant and cooperative in NAD Head:  Normocephalic and atraumatic. Neck:  Supple; no masses or thyromegaly. Lungs:  Clear throughout to auscultation.    Heart:  Regular rate and rhythm. Abdomen:  Soft, nontender and nondistended. Normal bowel sounds, without guarding, and without rebound.   Neurologic:  Alert and  oriented x4;  grossly normal neurologically.  Impression/Plan: Martha Waller is here for an colonoscopy to be performed for history of adenomatous polyps. Last colonoscopy was 05/02/2014  Risks, benefits, limitations, and alternatives regarding  colonoscopy have been reviewed with the patient.  Questions have been answered.  All parties agreeable.   [name], MD  04/11/2019, 8:22 AM

## 2019-04-11 NOTE — Op Note (Signed)
[facility] Gastroenterology Patient Name: Martha Waller Procedure Date: 04/11/2019 8:23 AM MRN: [id number] Account #: [id number] Date of Birth: [birth year] Admit Type: Outpatient Age: 64 Room: [facility] ENDO ROOM 4 Gender: Female Note Status: Finalized Procedure:             Colonoscopy Indications:           High risk colon cancer surveillance: Personal history                         of colonic polyps Providers:             [name] MD, MD Referring MD:          Edmonia Lynch. Aycock MD (Referring MD) Medicines:             Propofol per Anesthesia Complications:         No immediate complications. Procedure:             Pre-Anesthesia Assessment:                        - Prior to the procedure, a History and Physical was                         performed, and patient medications and allergies were                         reviewed. The patient's tolerance of previous                         anesthesia was also reviewed. The risks and benefits                         of the procedure and the sedation options and risks                         were discussed with the patient. All questions were                         answered, and informed consent was obtained. Prior                         Anticoagulants: The patient has taken no previous                         anticoagulant or antiplatelet agents. ASA Grade                         Assessment: II - A patient with mild systemic disease.                         After reviewing the risks and benefits, the patient                         was deemed in satisfactory condition to undergo the                         procedure.  After obtaining informed consent, the colonoscope was                         passed under direct vision. Throughout the procedure,                         the patient's blood pressure, pulse, and oxygen                         saturations were monitored continuously. The               Colonoscope was introduced through the anus and                         advanced to the the cecum, identified by appendiceal                         orifice and ileocecal valve. The colonoscopy was                         performed without difficulty. The patient tolerated                         the procedure well. The quality of the bowel                         preparation was good. Findings:      The perianal and digital rectal examinations were normal.      Two small angiodysplastic lesions without bleeding were found in the       cecum.      A 3 mm polyp was found in the transverse colon. The polyp was sessile.       The polyp was removed with a cold snare. Resection and retrieval were       complete.      A 3 mm polyp was found in the sigmoid colon. The polyp was sessile. The       polyp was removed with a cold biopsy forceps. Resection and retrieval       were complete.      Three sessile polyps were found in the rectum. The polyps were 2 to 3 mm       in size. These polyps were removed with a cold biopsy forceps. Resection       and retrieval were complete.      A few small-mouthed diverticula were found in the entire colon. Impression:            - Two non-bleeding colonic angiodysplastic lesions.                        - One 3 mm polyp in the transverse colon, removed with                         a cold snare. Resected and retrieved.                        - One 3 mm polyp in the sigmoid colon, removed with a  cold biopsy forceps. Resected and retrieved.                        - Three 2 to 3 mm polyps in the rectum, removed with a                         cold biopsy forceps. Resected and retrieved.                        - Diverticulosis in the entire examined colon. Recommendation:        - Discharge patient to home.                        - Resume previous diet.                        - Continue present medications.                         - Await pathology results.                        - Repeat colonoscopy in 5 years for surveillance. Procedure Code(s):     --- Professional ---                        [phone], Colonoscopy, flexible; with removal of                         tumor(s), polyp(s), or other lesion(s) by snare                         technique                        45380, 7, Colonoscopy, flexible; with biopsy, single                         or multiple Diagnosis Code(s):     --- Professional ---                        Z86.010, Personal history of colonic polyps                        K63.5, Polyp of colon                        K62.1, Rectal polyp CPT copyright 2019 American Medical Association. All rights reserved. The codes documented in this report are preliminary and upon coder review may  be revised to meet current compliance requirements. [name] MD, MD 04/11/2019 8:46:48 AM This report has been signed electronically. Number of Addenda: 0 Note Initiated On: 04/11/2019 8:23 AM Scope Withdrawal Time: 0 hours 9 minutes 23 seconds  Total Procedure Duration: 0 hours 14 minutes 37 seconds  Estimated Blood Loss:  Estimated blood loss: none.      [facility]

## 2019-04-11 NOTE — Transfer of Care (Signed)
Immediate Anesthesia Transfer of Care Note  Patient: Martha Waller  Procedure(s) Performed: COLONOSCOPY WITH PROPOFOL (N/A )  Patient Location: PACU  Anesthesia Type:General  Level of Consciousness: awake, alert , oriented and patient cooperative  Airway & Oxygen Therapy: Patient Spontanous Breathing  Post-op Assessment: Report given to RN, Post -op Vital signs reviewed and stable and Patient moving all extremities X 4  Post vital signs: Reviewed and stable  Last Vitals:  Vitals Value Taken Time  BP 124/85 04/11/19 0849  Temp 36.7 C 04/11/19 0848  Pulse 80 04/11/19 0850  Resp 18 04/11/19 0850  SpO2 99 % 04/11/19 0850  Vitals shown include unvalidated device data.  Last Pain:  Vitals:   04/11/19 0848  TempSrc: Temporal  PainSc: 0-No pain         Complications: No apparent anesthesia complications

## 2019-04-11 NOTE — Anesthesia Preprocedure Evaluation (Addendum)
Anesthesia Evaluation  Patient identified by MRN, date of birth, ID band Patient awake    Reviewed: Allergy & Precautions, H&P , NPO status , Patient's Chart, lab work & pertinent test results, reviewed documented beta blocker date and time   History of Anesthesia Complications Negative for: history of anesthetic complications  Airway Mallampati: III  TM Distance: >3 FB Neck ROM: full    Dental  (+) Dental Advidsory Given, Upper Dentures   Pulmonary neg pulmonary ROS, former smoker,    Pulmonary exam normal        Cardiovascular Exercise Tolerance: Good hypertension, (-) angina(-) Past MI and (-) Cardiac Stents Normal cardiovascular exam(-) dysrhythmias (-) Valvular Problems/Murmurs     Neuro/Psych negative neurological ROS  negative psych ROS   GI/Hepatic negative GI ROS, Neg liver ROS,   Endo/Other  diabetes (hasn't been on meds for 11 years s/p gastric bypass)Hypothyroidism Morbid obesity  Renal/GU negative Renal ROS  negative genitourinary   Musculoskeletal   Abdominal   Peds  Hematology negative hematology ROS (+)   Anesthesia Other Findings Past Medical History: 03/2012: Breast cancer ([place])     Comment:  DCIS, 2 cm, intermediate grade, 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 left               breast with lumpectomy and Mammosite radiation.  2013: Breast complaint 2014: Carcinoma in situ of breast     Comment:  left No date: Colon polyp 2010: Diabetes mellitus without complication ([place])     Comment:  type 2, resolved since gastric bypass surgery No date: Hypertension 2014: Mammographic microcalcification   Reproductive/Obstetrics negative OB ROS                            Anesthesia Physical Anesthesia Plan  ASA: III  Anesthesia Plan: General   Post-op Pain Management:    Induction: Intravenous  PONV Risk Score and Plan: 3 and Propofol infusion and TIVA  Airway Management Planned: Natural  Airway and Nasal Cannula  Additional Equipment:   Intra-op Plan:   Post-operative Plan:   Informed Consent: I have reviewed the patients History and Physical, chart, labs and discussed the procedure including the risks, benefits and alternatives for the proposed anesthesia with the patient or authorized representative who has indicated his/her understanding and acceptance.     Dental Advisory Given  Plan Discussed with: Anesthesiologist, CRNA and Surgeon  Anesthesia Plan Comments:         Anesthesia Quick Evaluation

## 2019-04-11 NOTE — Anesthesia Postprocedure Evaluation (Signed)
Anesthesia Post Note  Patient: ADAOBI BIAGIONI  Procedure(s) Performed: COLONOSCOPY WITH PROPOFOL (N/A )  Patient location during evaluation: Endoscopy Anesthesia Type: General Level of consciousness: awake and alert Pain management: pain level controlled Vital Signs Assessment: post-procedure vital signs reviewed and stable Respiratory status: spontaneous breathing, nonlabored ventilation, respiratory function stable and patient connected to nasal cannula oxygen Cardiovascular status: blood pressure returned to baseline and stable Postop Assessment: no apparent nausea or vomiting Anesthetic complications: no     Last Vitals:  Vitals:   04/11/19 0848 04/11/19 0858  BP: 124/85 122/85  Pulse: 79   Resp: 12   Temp: 36.7 C   SpO2: 100%     Last Pain:  Vitals:   04/11/19 0858  TempSrc:   PainSc: 0-No pain                 Martha Clan

## 2019-04-12 ENCOUNTER — Encounter: Payer: Self-pay | Admitting: *Deleted

## 2019-04-12 ENCOUNTER — Encounter: Payer: Self-pay | Admitting: Gastroenterology

## 2019-04-12 LAB — SURGICAL PATHOLOGY

## 2019-05-06 ENCOUNTER — Ambulatory Visit: Payer: BC Managed Care – PPO | Attending: Internal Medicine

## 2019-05-06 DIAGNOSIS — Z23 Encounter for immunization: Secondary | ICD-10-CM

## 2019-05-06 NOTE — Progress Notes (Signed)
   Covid-19 Vaccination Clinic  Name:  Martha Waller    MRN: [id number] DOB: 1955-05-14  05/06/2019  [name] was observed post Covid-19 immunization for 15 minutes without incident. She was provided with Vaccine Information Sheet and instruction to access the V-Safe system.   [name] was instructed to call 911 with any severe reactions post vaccine: Marland Kitchen Difficulty breathing  . Swelling of face and throat  . A fast heartbeat  . A bad rash all over body  . Dizziness and weakness   Immunizations Administered    Name Date Dose VIS Date Route   Moderna COVID-19 Vaccine 05/06/2019  8:47 AM 0.5 mL 01/10/2019 Intramuscular   Manufacturer: Moderna   Lot: [id number]   [id number]

## 2019-06-27 ENCOUNTER — Other Ambulatory Visit: Payer: Self-pay | Admitting: Family Medicine

## 2019-06-28 ENCOUNTER — Other Ambulatory Visit: Payer: Self-pay | Admitting: Family Medicine

## 2019-06-28 DIAGNOSIS — Z1231 Encounter for screening mammogram for malignant neoplasm of breast: Secondary | ICD-10-CM

## 2019-07-20 ENCOUNTER — Ambulatory Visit
Admission: RE | Admit: 2019-07-20 | Discharge: 2019-07-20 | Disposition: A | Discharge status: Home / Self Care | Payer: BC Managed Care – PPO | Source: Ambulatory Visit | Attending: Family Medicine | Admitting: Family Medicine

## 2019-07-20 DIAGNOSIS — Z1231 Encounter for screening mammogram for malignant neoplasm of breast: Secondary | ICD-10-CM | POA: Insufficient documentation

## 2019-08-31 ENCOUNTER — Other Ambulatory Visit: Payer: Self-pay

## 2019-08-31 ENCOUNTER — Emergency Department
Admission: EM | Admit: 2019-08-31 | Discharge: 2019-08-31 | Disposition: A | Discharge status: Home / Self Care | Payer: BC Managed Care – PPO | Attending: Emergency Medicine | Admitting: Emergency Medicine

## 2019-08-31 ENCOUNTER — Emergency Department: Payer: BC Managed Care – PPO

## 2019-08-31 DIAGNOSIS — I10 Essential (primary) hypertension: Secondary | ICD-10-CM | POA: Diagnosis not present

## 2019-08-31 DIAGNOSIS — M545 Low back pain, unspecified: Secondary | ICD-10-CM

## 2019-08-31 DIAGNOSIS — Y999 Unspecified external cause status: Secondary | ICD-10-CM | POA: Diagnosis not present

## 2019-08-31 DIAGNOSIS — Y92410 Unspecified street and highway as the place of occurrence of the external cause: Secondary | ICD-10-CM | POA: Diagnosis not present

## 2019-08-31 DIAGNOSIS — C50912 Malignant neoplasm of unspecified site of left female breast: Secondary | ICD-10-CM | POA: Insufficient documentation

## 2019-08-31 DIAGNOSIS — Y9389 Activity, other specified: Secondary | ICD-10-CM | POA: Diagnosis not present

## 2019-08-31 DIAGNOSIS — Z87891 Personal history of nicotine dependence: Secondary | ICD-10-CM | POA: Diagnosis not present

## 2019-08-31 DIAGNOSIS — E119 Type 2 diabetes mellitus without complications: Secondary | ICD-10-CM | POA: Diagnosis not present

## 2019-08-31 DIAGNOSIS — Z041 Encounter for examination and observation following transport accident: Secondary | ICD-10-CM | POA: Insufficient documentation

## 2019-08-31 MED ORDER — HYDROCODONE-ACETAMINOPHEN 5-325 MG PO TABS: 1.0000 | ORAL_TABLET | Freq: Four times a day (QID) | ORAL | 0 refills | Status: DC | PRN

## 2019-08-31 MED ORDER — ACETAMINOPHEN 500 MG PO TABS
1000.0000 mg | ORAL_TABLET | Freq: Once | ORAL | Status: AC
  Administered 2019-08-31: 1000 mg via ORAL
  Filled 2019-08-31: qty 2

## 2019-08-31 MED ORDER — HYDROCODONE-ACETAMINOPHEN 5-325 MG PO TABS: 1.0000 | ORAL_TABLET | Freq: Four times a day (QID) | ORAL | 0 refills | Status: AC | PRN

## 2019-08-31 MED ORDER — IBUPROFEN 600 MG PO TABS
600.0000 mg | ORAL_TABLET | Freq: Once | ORAL | Status: DC
  Filled 2019-08-31: qty 1
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
## 2019-08-31 NOTE — Discharge Instructions (Signed)
Follow-up with your primary care provider if any continued problems or concerns.  Begin using ice or heat to your back as needed for discomfort.  The pain medication that was sent to your pharmacy contains Tylenol.  Do not take additional Tylenol with this medication.  This can also cause drowsiness and should not be taken while you are driving or operating machinery.

## 2019-08-31 NOTE — ED Notes (Signed)
Patient transported to X-ray 

## 2020-01-28 IMAGING — MR MRI CERVICAL SPINE WITHOUT CONTRAST
4 of 5 series · 28 of 48 positions shown · non-contrast
Comparison: None available.

CLINICAL DATA: Initial evaluation for neck pain radiating into the
left upper extremity with numbness in fingers.

EXAM:
MRI CERVICAL SPINE WITHOUT CONTRAST
TECHNIQUE: Multiplanar, multisequence MR imaging of the cervical spine was
performed. No intravenous contrast was administered.

[Series 2: T2 · sagittal · 3.0mm · 0.41mm/px · 6 of 13 slices shown (1 of 2)]
[im 1/13]
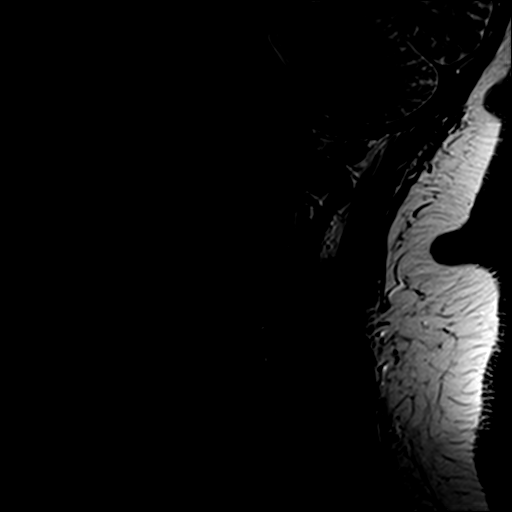
[im 3/13]
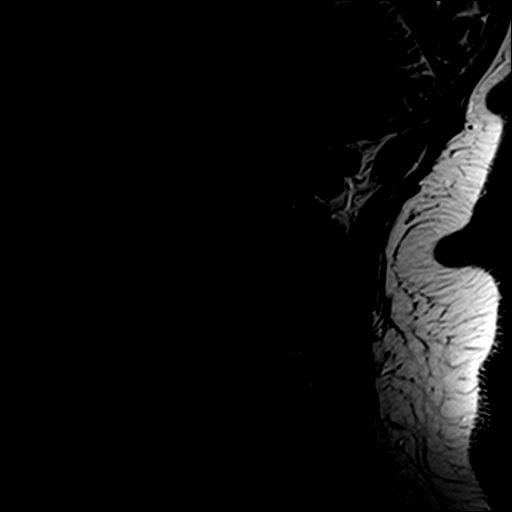
[im 5/13]
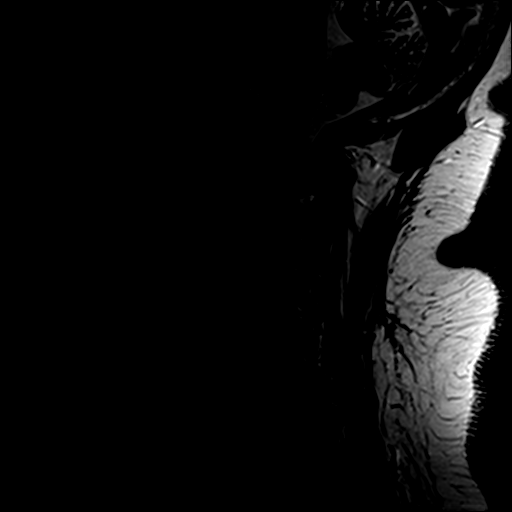
[im 8/13]
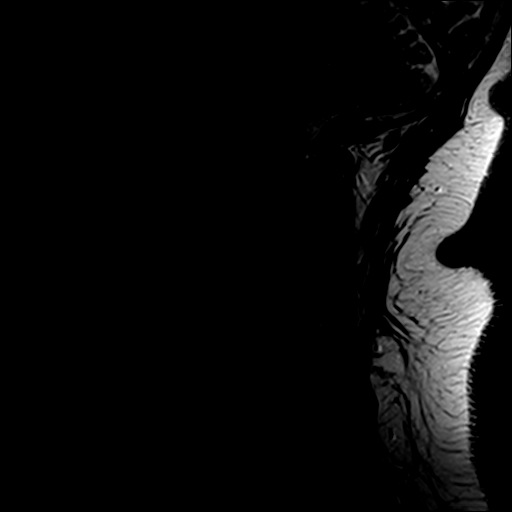
[im 10/13]
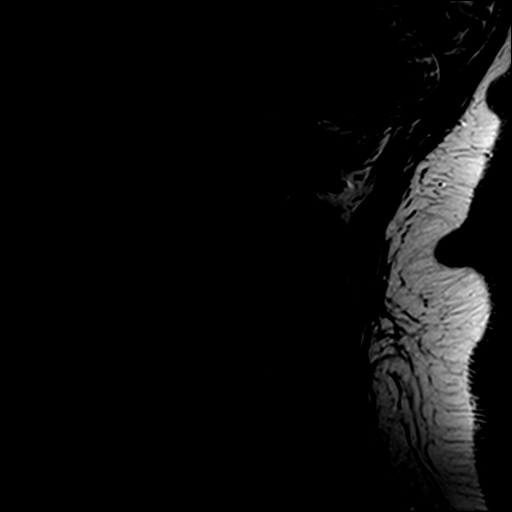
[im 13/13]
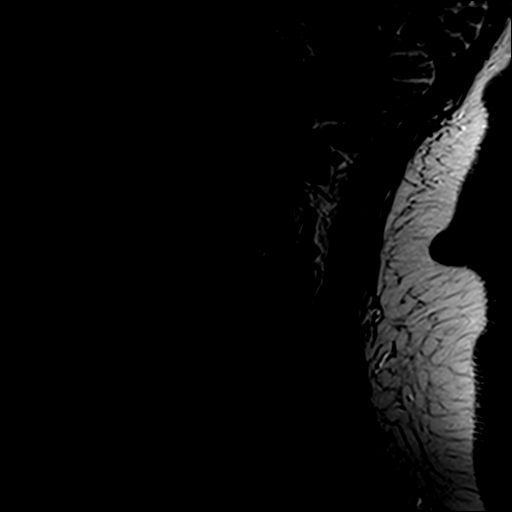

[Series 3: T1 · sagittal · 3.0mm · 0.41mm/px · 7 of 13 slices shown]
[im 1/13]
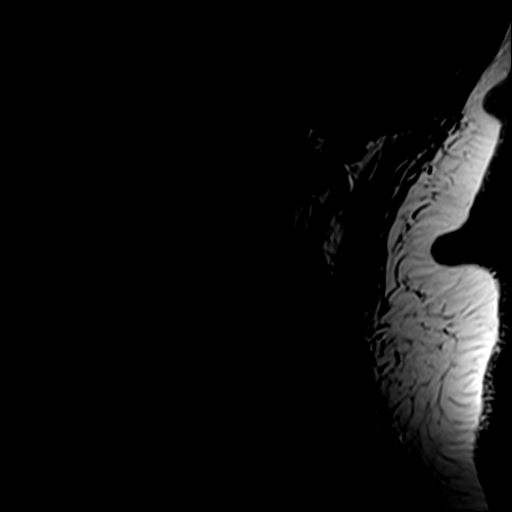
[im 3/13]
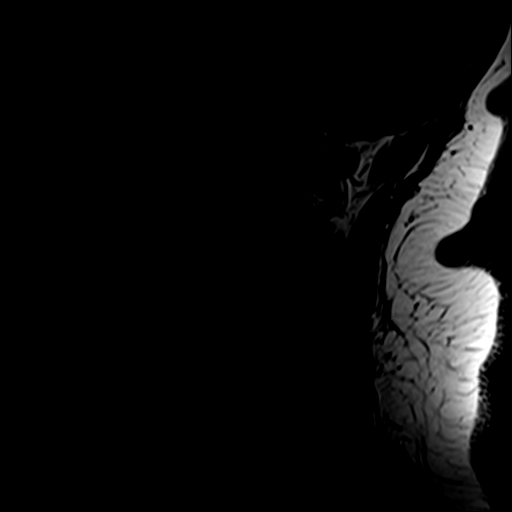
[im 5/13]
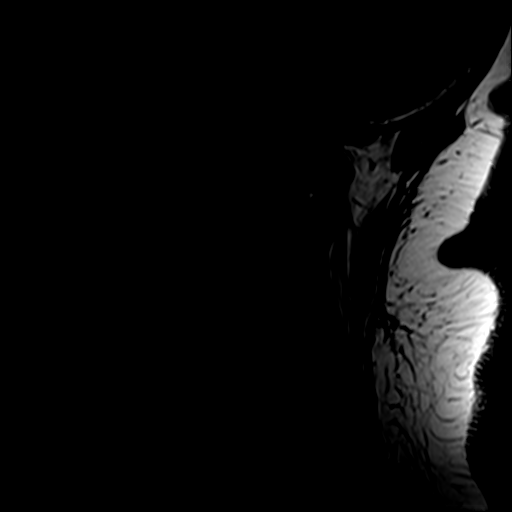
[im 7/13]
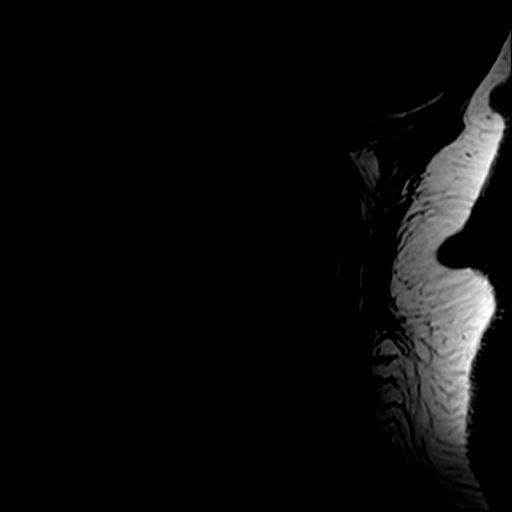
[im 9/13]
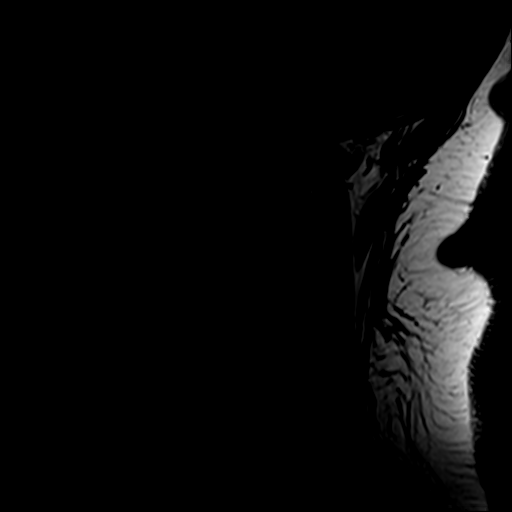
[im 11/13]
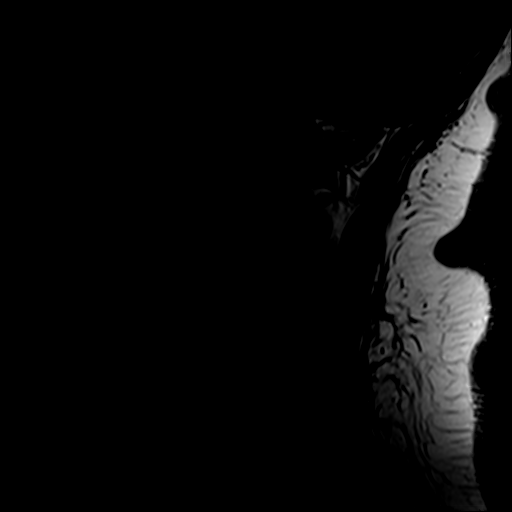
[im 13/13]
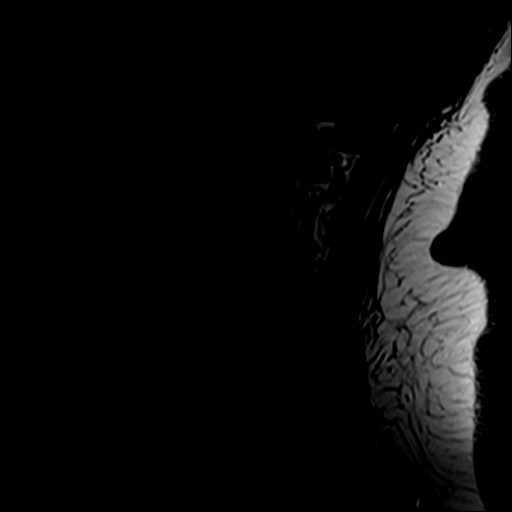

[Series 4: STIR · sagittal · 3.0mm · 0.82mm/px · 7 of 13 slices shown]
[im 1/13]
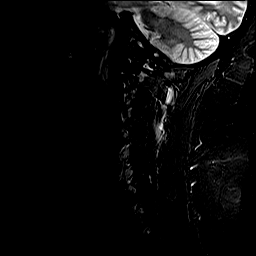
[im 3/13]
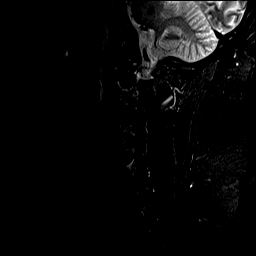
[im 5/13]
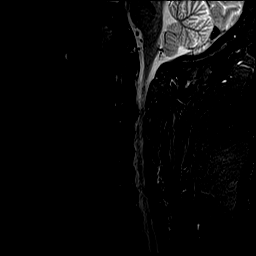
[im 7/13]
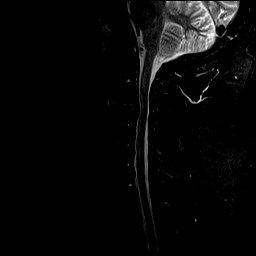
[im 9/13]
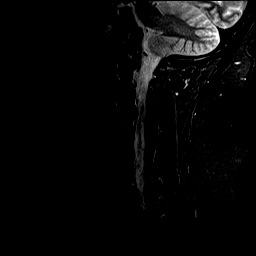
[im 11/13]
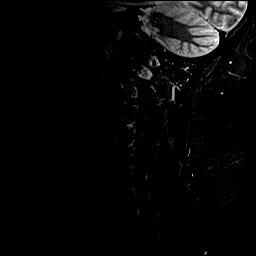
[im 13/13]
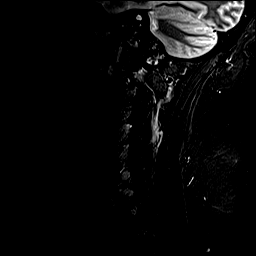

[Series 5: T2 · axial · 3.0mm · 0.74mm/px · z∈[-42,+60]mm · 8 of 28 slices shown (2 of 2)]
[im 1/28]
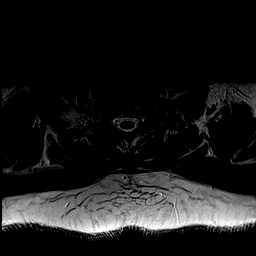
[im 5/28]
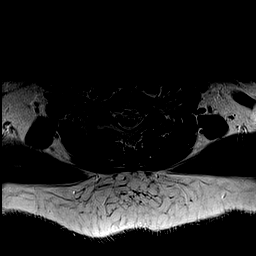
[im 9/28]
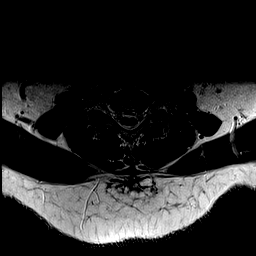
[im 13/28]
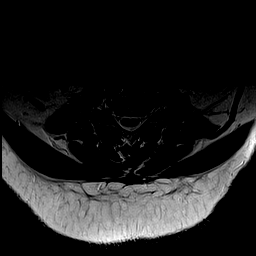
[im 15/28]
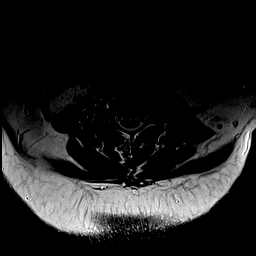
[im 19/28]
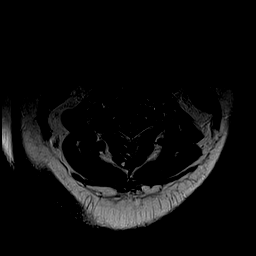
[im 23/28]
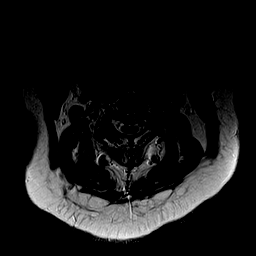
[im 28/28]
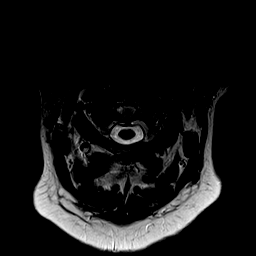

[28 of 48 positions shown; findings below may reference images not displayed]

FINDINGS: Alignment: Straightening of the normal cervical lordosis. Trace
retrolisthesis of C3 on C4, with trace anterolisthesis of C7 on T1,
likely chronic and facet mediated.

Vertebrae: Vertebral body height maintained without evidence for
acute or chronic fracture. Bone marrow signal intensity within
normal limits. Benign hemangioma noted within the T1 vertebral body.
No other discrete or worrisome osseous lesions. No abnormal marrow
edema.

Cord: Signal intensity within the cervical spinal cord is within
normal limits.

Posterior Fossa, vertebral arteries, paraspinal tissues: Visualized
brain and posterior fossa within normal limits. Craniocervical
junction normal. Paraspinous and prevertebral soft tissues are
normal. Normal intravascular flow voids seen within the vertebral
arteries bilaterally.

Disc levels:

C2-C3: Shallow central disc protrusion mildly indents the ventral
thecal sac. Mild spinal stenosis without cord impingement.
Superimposed mild facet hypertrophy. Foramina remain patent.

C3-C4: Trace retrolisthesis. Chronic intervertebral disc space
narrowing with diffuse disc bulge and bilateral uncovertebral
hypertrophy. Broad posterior disc osteophyte flattens and partially
effaces the ventral thecal sac. Resultant mild spinal stenosis with
mild flattening of the ventral spinal cord. Moderate right worse
than left C4 foraminal stenosis.

C4-C5: Mild diffuse disc bulge. No significant spinal stenosis. Mild
bilateral C5 foraminal narrowing.

C5-C6: Mild diffuse disc bulge. No significant spinal stenosis. Mild
bilateral C6 foraminal stenosis.

C6-C7: Mild diffuse disc bulge. Superimposed mild uncovertebral
hypertrophy. No significant spinal stenosis. Foramina remain patent.

C7-T1: Left eccentric disc bulge with bilateral uncovertebral
hypertrophy. Mild bilateral facet degeneration. No significant
spinal stenosis. Severe left with mild right C8 foraminal stenosis.

Visualized upper thoracic spine demonstrates mild disc bulging at
T1-2 and T2-3 without significant stenosis.
IMPRESSION: 1. Left eccentric disc bulge at C7-T1 with resultant moderate left
foraminal stenosis. Query left C8 radiculitis.
2. Disc bulging with uncovertebral hypertrophy at C3-4 with
resultant mild canal with moderate bilateral C4 foraminal stenosis.
3. Mild disc bulging at C4-5 and C5-6 with resultant mild bilateral
C5 and C6 foraminal stenosis.

## 2020-06-21 ENCOUNTER — Other Ambulatory Visit: Payer: Self-pay | Admitting: Family Medicine

## 2020-06-21 DIAGNOSIS — Z1231 Encounter for screening mammogram for malignant neoplasm of breast: Secondary | ICD-10-CM

## 2020-07-23 ENCOUNTER — Other Ambulatory Visit: Payer: Self-pay

## 2020-07-23 ENCOUNTER — Ambulatory Visit
Admission: RE | Admit: 2020-07-23 | Discharge: 2020-07-23 | Disposition: A | Discharge status: Home / Self Care | Payer: BC Managed Care – PPO | Source: Ambulatory Visit | Attending: Family Medicine | Admitting: Family Medicine

## 2020-07-23 DIAGNOSIS — Z1231 Encounter for screening mammogram for malignant neoplasm of breast: Secondary | ICD-10-CM | POA: Diagnosis present

## 2021-07-15 ENCOUNTER — Other Ambulatory Visit: Payer: Self-pay | Admitting: Family Medicine

## 2021-07-15 DIAGNOSIS — Z1231 Encounter for screening mammogram for malignant neoplasm of breast: Secondary | ICD-10-CM

## 2021-08-07 ENCOUNTER — Ambulatory Visit
Admission: RE | Admit: 2021-08-07 | Discharge: 2021-08-07 | Disposition: A | Discharge status: Home / Self Care | Payer: BC Managed Care – PPO | Source: Ambulatory Visit | Attending: Family Medicine | Admitting: Family Medicine

## 2021-08-07 DIAGNOSIS — Z1231 Encounter for screening mammogram for malignant neoplasm of breast: Secondary | ICD-10-CM | POA: Insufficient documentation

## 2022-02-17 ENCOUNTER — Other Ambulatory Visit: Payer: Self-pay | Admitting: Family Medicine

## 2022-02-17 DIAGNOSIS — Z1231 Encounter for screening mammogram for malignant neoplasm of breast: Secondary | ICD-10-CM

## 2022-03-31 ENCOUNTER — Ambulatory Visit (INDEPENDENT_AMBULATORY_CARE_PROVIDER_SITE_OTHER): Payer: BC Managed Care – PPO | Admitting: Vascular Surgery

## 2022-03-31 ENCOUNTER — Encounter (INDEPENDENT_AMBULATORY_CARE_PROVIDER_SITE_OTHER): Payer: Self-pay | Admitting: Vascular Surgery

## 2022-03-31 VITALS — BP 145/88 | HR 83 | Resp 16 | Wt 221.0 lb

## 2022-03-31 DIAGNOSIS — M79604 Pain in right leg: Secondary | ICD-10-CM

## 2022-03-31 DIAGNOSIS — M79609 Pain in unspecified limb: Secondary | ICD-10-CM | POA: Insufficient documentation

## 2022-03-31 DIAGNOSIS — I10 Essential (primary) hypertension: Secondary | ICD-10-CM

## 2022-03-31 NOTE — Assessment & Plan Note (Signed)
blood pressure control important in reducing the progression of atherosclerotic disease. On appropriate oral medications.  
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
## 2022-03-31 NOTE — Assessment & Plan Note (Signed)
The patient has a soft palpable area that has the appearance of a varicose vein on the right lateral leg associated with an area of previous trauma that is chronically painful.  We will get a venous reflux study in the near future at her convenience to try to further assess both this and her deep and superficial venous systems.  I discussed the pathophysiology and natural history of venous disease.  I discussed this still may be some degree of neuropathy from the trauma to the area as clearly this is in the lateral portion of the leg where neuropathic pain may be prominent.  I have recommended she begin wearing compression socks on a daily basis.  I discussed the reason and rationale for compression hose therapy.  I will see her back with her duplex in the near future.

## 2022-04-06 ENCOUNTER — Ambulatory Visit (INDEPENDENT_AMBULATORY_CARE_PROVIDER_SITE_OTHER): Payer: BC Managed Care – PPO | Admitting: Nurse Practitioner

## 2022-04-06 ENCOUNTER — Encounter (INDEPENDENT_AMBULATORY_CARE_PROVIDER_SITE_OTHER): Payer: Self-pay | Admitting: Nurse Practitioner

## 2022-04-06 ENCOUNTER — Ambulatory Visit (INDEPENDENT_AMBULATORY_CARE_PROVIDER_SITE_OTHER): Payer: BC Managed Care – PPO

## 2022-04-06 VITALS — BP 138/81 | HR 82 | Resp 18 | Ht 68.0 in | Wt 217.4 lb

## 2022-04-06 DIAGNOSIS — I10 Essential (primary) hypertension: Secondary | ICD-10-CM

## 2022-04-06 DIAGNOSIS — M79604 Pain in right leg: Secondary | ICD-10-CM

## 2022-04-07 ENCOUNTER — Encounter (INDEPENDENT_AMBULATORY_CARE_PROVIDER_SITE_OTHER): Payer: Self-pay | Admitting: Nurse Practitioner
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
## 2022-08-10 ENCOUNTER — Ambulatory Visit
Admission: RE | Admit: 2022-08-10 | Discharge: 2022-08-10 | Disposition: A | Discharge status: Home / Self Care | Payer: BC Managed Care – PPO | Source: Ambulatory Visit | Attending: Family Medicine | Admitting: Family Medicine

## 2022-08-10 DIAGNOSIS — Z1231 Encounter for screening mammogram for malignant neoplasm of breast: Secondary | ICD-10-CM | POA: Diagnosis present

## 2023-06-29 ENCOUNTER — Other Ambulatory Visit: Payer: Self-pay | Admitting: Family Medicine

## 2023-06-29 DIAGNOSIS — Z1231 Encounter for screening mammogram for malignant neoplasm of breast: Secondary | ICD-10-CM

## 2023-08-11 ENCOUNTER — Ambulatory Visit
Admission: RE | Admit: 2023-08-11 | Discharge: 2023-08-11 | Disposition: A | Discharge status: Home / Self Care | Payer: Self-pay | Source: Ambulatory Visit | Attending: Family Medicine | Admitting: Family Medicine

## 2023-08-11 DIAGNOSIS — Z1231 Encounter for screening mammogram for malignant neoplasm of breast: Secondary | ICD-10-CM | POA: Diagnosis present
